# Patient Record
Sex: Female | Born: 1935
Health system: Southern US, Community
[De-identification: ages and names within clinical notes are randomized; demographics above are authoritative.]

## PROBLEM LIST (undated history)

## (undated) DIAGNOSIS — M1990 Unspecified osteoarthritis, unspecified site: Secondary | ICD-10-CM

## (undated) DIAGNOSIS — S329XXA Fracture of unspecified parts of lumbosacral spine and pelvis, initial encounter for closed fracture: Secondary | ICD-10-CM

## (undated) DIAGNOSIS — E0590 Thyrotoxicosis, unspecified without thyrotoxic crisis or storm: Secondary | ICD-10-CM

## (undated) DIAGNOSIS — E785 Hyperlipidemia, unspecified: Secondary | ICD-10-CM

## (undated) DIAGNOSIS — I513 Intracardiac thrombosis, not elsewhere classified: Secondary | ICD-10-CM

## (undated) DIAGNOSIS — I10 Essential (primary) hypertension: Secondary | ICD-10-CM

## (undated) DIAGNOSIS — I4891 Unspecified atrial fibrillation: Secondary | ICD-10-CM

## (undated) HISTORY — DX: Fracture of unspecified parts of lumbosacral spine and pelvis, initial encounter for closed fracture: S32.9XXA

## (undated) HISTORY — DX: Unspecified atrial fibrillation: I48.91

## (undated) HISTORY — DX: Hyperlipidemia, unspecified: E78.5

## (undated) HISTORY — DX: Thyrotoxicosis, unspecified without thyrotoxic crisis or storm: E05.90

## (undated) HISTORY — DX: Unspecified osteoarthritis, unspecified site: M19.90

## (undated) HISTORY — DX: Intracardiac thrombosis, not elsewhere classified: I51.3

## (undated) HISTORY — DX: Essential (primary) hypertension: I10

---
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
## 2006-11-30 ENCOUNTER — Encounter: Payer: Self-pay | Admitting: Internal Medicine

## 2008-06-21 ENCOUNTER — Ambulatory Visit: Payer: Self-pay | Admitting: Internal Medicine

## 2008-06-21 DIAGNOSIS — I10 Essential (primary) hypertension: Secondary | ICD-10-CM | POA: Insufficient documentation

## 2008-06-21 DIAGNOSIS — M1990 Unspecified osteoarthritis, unspecified site: Secondary | ICD-10-CM | POA: Insufficient documentation

## 2008-06-21 DIAGNOSIS — E0590 Thyrotoxicosis, unspecified without thyrotoxic crisis or storm: Secondary | ICD-10-CM | POA: Insufficient documentation

## 2008-06-21 DIAGNOSIS — E785 Hyperlipidemia, unspecified: Secondary | ICD-10-CM

## 2008-06-21 DIAGNOSIS — M810 Age-related osteoporosis without current pathological fracture: Secondary | ICD-10-CM | POA: Insufficient documentation

## 2008-06-26 LAB — CONVERTED CEMR LAB
Albumin: 4.2 g/dL (ref 3.5–5.2)
Basophils Absolute: 0 10*3/uL (ref 0.0–0.1)
Basophils Relative: 0.4 % (ref 0.0–3.0)
Creatinine, Ser: 0.8 mg/dL (ref 0.4–1.2)
Eosinophils Absolute: 0.1 10*3/uL (ref 0.0–0.7)
GFR calc Af Amer: 88 mL/min
GFR calc non Af Amer: 73 mL/min
Hemoglobin: 15.8 g/dL — ABNORMAL HIGH (ref 12.0–15.0)
Lymphocytes Relative: 22.7 % (ref 12.0–46.0)
MCHC: 34.8 g/dL (ref 30.0–36.0)
MCV: 91 fL (ref 78.0–100.0)
Neutro Abs: 4 10*3/uL (ref 1.4–7.7)
Neutrophils Relative %: 64.8 % (ref 43.0–77.0)
Phosphorus: 3.4 mg/dL (ref 2.3–4.6)
RBC: 4.98 M/uL (ref 3.87–5.11)
RDW: 13.7 % (ref 11.5–14.6)
Sodium: 143 meq/L (ref 135–145)

## 2008-07-19 ENCOUNTER — Telehealth: Payer: Self-pay | Admitting: Internal Medicine

## 2008-09-16 ENCOUNTER — Ambulatory Visit: Payer: Self-pay | Admitting: Cardiovascular Disease

## 2008-09-16 ENCOUNTER — Ambulatory Visit: Payer: Self-pay | Admitting: Family Medicine

## 2008-09-16 DIAGNOSIS — R002 Palpitations: Secondary | ICD-10-CM

## 2008-09-19 ENCOUNTER — Encounter: Payer: Self-pay | Admitting: Internal Medicine

## 2008-09-19 ENCOUNTER — Ambulatory Visit: Payer: Self-pay | Admitting: Internal Medicine

## 2008-09-19 ENCOUNTER — Encounter: Payer: Self-pay | Admitting: Cardiovascular Disease

## 2008-09-19 ENCOUNTER — Ambulatory Visit: Payer: Self-pay

## 2008-09-19 ENCOUNTER — Inpatient Hospital Stay (HOSPITAL_COMMUNITY): Admission: AD | Admit: 2008-09-19 | Discharge: 2008-09-20 | Payer: Self-pay | Admitting: Internal Medicine

## 2008-09-20 ENCOUNTER — Encounter (INDEPENDENT_AMBULATORY_CARE_PROVIDER_SITE_OTHER): Payer: Self-pay | Admitting: Pediatrics

## 2008-09-20 ENCOUNTER — Ambulatory Visit: Payer: Self-pay | Admitting: Vascular Surgery

## 2008-09-24 ENCOUNTER — Ambulatory Visit: Payer: Self-pay | Admitting: Internal Medicine

## 2008-09-24 ENCOUNTER — Telehealth: Payer: Self-pay | Admitting: Internal Medicine

## 2008-09-26 ENCOUNTER — Telehealth: Payer: Self-pay | Admitting: Internal Medicine

## 2008-09-30 ENCOUNTER — Ambulatory Visit: Payer: Self-pay | Admitting: Internal Medicine

## 2008-09-30 DIAGNOSIS — I4891 Unspecified atrial fibrillation: Secondary | ICD-10-CM | POA: Insufficient documentation

## 2008-09-30 LAB — CONVERTED CEMR LAB: INR: 3.4

## 2008-10-02 ENCOUNTER — Ambulatory Visit: Payer: Self-pay | Admitting: Internal Medicine

## 2008-10-02 LAB — CONVERTED CEMR LAB
INR: 3.6
Prothrombin Time: 23 s

## 2008-10-09 ENCOUNTER — Ambulatory Visit: Payer: Self-pay | Admitting: Internal Medicine

## 2008-10-09 LAB — CONVERTED CEMR LAB
INR: 2.3
Prothrombin Time: 18.7 s

## 2008-10-23 ENCOUNTER — Ambulatory Visit: Payer: Self-pay | Admitting: Internal Medicine

## 2008-11-20 ENCOUNTER — Ambulatory Visit: Payer: Self-pay | Admitting: Internal Medicine

## 2008-12-04 ENCOUNTER — Ambulatory Visit: Payer: Self-pay | Admitting: Internal Medicine

## 2008-12-04 LAB — CONVERTED CEMR LAB: INR: 2.9

## 2009-01-01 ENCOUNTER — Ambulatory Visit: Payer: Self-pay | Admitting: Internal Medicine

## 2009-01-01 LAB — CONVERTED CEMR LAB
INR: 3.3
Prothrombin Time: 21.8 s

## 2009-01-02 LAB — CONVERTED CEMR LAB
BUN: 16 mg/dL (ref 6–23)
CO2: 29 meq/L (ref 19–32)
Creatinine, Ser: 0.8 mg/dL (ref 0.4–1.2)
Eosinophils Absolute: 0.1 10*3/uL (ref 0.0–0.7)
Eosinophils Relative: 1.1 % (ref 0.0–5.0)
Free T4: 0.7 ng/dL (ref 0.6–1.6)
Glucose, Bld: 88 mg/dL (ref 70–99)
Lymphocytes Relative: 15 % (ref 12.0–46.0)
MCV: 91.7 fL (ref 78.0–100.0)
Monocytes Absolute: 0.7 10*3/uL (ref 0.1–1.0)
Neutrophils Relative %: 73.8 % (ref 43.0–77.0)
Phosphorus: 4.3 mg/dL (ref 2.3–4.6)
Platelets: 191 10*3/uL (ref 150.0–400.0)
TSH: 3.26 microintl units/mL (ref 0.35–5.50)
WBC: 6.9 10*3/uL (ref 4.5–10.5)

## 2009-01-15 ENCOUNTER — Ambulatory Visit: Payer: Self-pay | Admitting: Internal Medicine

## 2009-01-15 LAB — CONVERTED CEMR LAB
INR: 2.3
Prothrombin Time: 18.7 s

## 2009-01-29 ENCOUNTER — Ambulatory Visit: Payer: Self-pay | Admitting: Internal Medicine

## 2009-01-29 LAB — CONVERTED CEMR LAB: Prothrombin Time: 19.9 s

## 2009-02-26 ENCOUNTER — Ambulatory Visit: Payer: Self-pay | Admitting: Internal Medicine

## 2009-02-26 LAB — CONVERTED CEMR LAB: Prothrombin Time: 17.9 s

## 2009-03-27 ENCOUNTER — Telehealth: Payer: Self-pay | Admitting: Internal Medicine

## 2009-03-31 ENCOUNTER — Encounter: Payer: Self-pay | Admitting: Internal Medicine

## 2009-03-31 ENCOUNTER — Ambulatory Visit: Payer: Self-pay

## 2009-04-04 ENCOUNTER — Ambulatory Visit: Payer: Self-pay | Admitting: Internal Medicine

## 2009-09-30 ENCOUNTER — Ambulatory Visit: Payer: Self-pay | Admitting: Internal Medicine

## 2009-10-02 LAB — CONVERTED CEMR LAB
AST: 26 units/L (ref 0–37)
Alkaline Phosphatase: 83 units/L (ref 39–117)
Basophils Relative: 0.1 % (ref 0.0–3.0)
Chloride: 104 meq/L (ref 96–112)
Eosinophils Relative: 1.6 % (ref 0.0–5.0)
Free T4: 0.6 ng/dL (ref 0.6–1.6)
GFR calc non Af Amer: 72.26 mL/min (ref >60)
Hemoglobin: 14 g/dL (ref 12.0–15.0)
Lymphocytes Relative: 18.7 % (ref 12.0–46.0)
Monocytes Relative: 9.4 % (ref 3.0–12.0)
Neutro Abs: 4.6 10*3/uL (ref 1.4–7.7)
Phosphorus: 4.4 mg/dL (ref 2.3–4.6)
Potassium: 4.5 meq/L (ref 3.5–5.1)
RBC: 4.75 M/uL (ref 3.87–5.11)
TSH: 4.44 microintl units/mL (ref 0.35–5.50)
Total Protein: 6.9 g/dL (ref 6.0–8.3)

## 2010-01-28 ENCOUNTER — Ambulatory Visit: Payer: Self-pay | Admitting: Internal Medicine

## 2010-02-23 ENCOUNTER — Telehealth: Payer: Self-pay | Admitting: Internal Medicine

## 2010-06-01 ENCOUNTER — Ambulatory Visit: Payer: Self-pay | Admitting: Internal Medicine

## 2010-06-03 LAB — CONVERTED CEMR LAB
Albumin: 4.4 g/dL (ref 3.5–5.2)
Alkaline Phosphatase: 79 units/L (ref 39–117)
BUN: 17 mg/dL (ref 6–23)
Basophils Absolute: 0 10*3/uL (ref 0.0–0.1)
Chloride: 104 meq/L (ref 96–112)
Eosinophils Absolute: 0.1 10*3/uL (ref 0.0–0.7)
Free T4: 0.97 ng/dL (ref 0.60–1.60)
Glucose, Bld: 89 mg/dL (ref 70–99)
Hemoglobin: 15 g/dL (ref 12.0–15.0)
Lymphocytes Relative: 13.8 % (ref 12.0–46.0)
MCHC: 33.9 g/dL (ref 30.0–36.0)
Monocytes Absolute: 0.8 10*3/uL (ref 0.1–1.0)
Neutro Abs: 7.1 10*3/uL (ref 1.4–7.7)
Phosphorus: 3.9 mg/dL (ref 2.3–4.6)
RDW: 14.7 % — ABNORMAL HIGH (ref 11.5–14.6)

## 2010-10-02 ENCOUNTER — Ambulatory Visit
Admission: RE | Admit: 2010-10-02 | Discharge: 2010-10-02 | Payer: Self-pay | Source: Home / Self Care | Attending: Internal Medicine | Admitting: Internal Medicine

## 2010-10-06 NOTE — Progress Notes (Signed)
Summary: irregular heart rate  Phone Note Call from Patient Call back at Home Phone [phone]   Caller: Patient Call For: Cindee Salt MD Summary of Call: Pt has been having an irregular heart rate since her thyroid medicine dose was reduced.  She has no other sxs, other than some weakness but she says she always has that.  Do you want to work her in sometime? Initial call taken by: Lowella Petties CMA,  February 23, 2010 3:57 PM  Follow-up for Phone Call        might have to go back up on the dose if her heart is really going faster okay to add her on but I don't have any appts till Thursday at 1:45 Follow-up by: Cindee Salt MD,  February 23, 2010 4:31 PM  Additional Follow-up for Phone Call Additional follow up Details #1::        spoke with patient, she doesn't have rapid heartbeat, it's irregular per patient. She states it skips a beat? this started 3-4 days ago, she doesn't know if this is because of the thyroid med or not. Pt just wanted to let you know, she doesn't think she really needs an appt per pt. DeShannon Smith CMA Duncan Dull)  February 23, 2010 5:03 PM   Okay to have her just monitor and let me know if she thinks it is more of a problem--- or causes chest pain, dizziness, etc Additional Follow-up by: Cindee Salt MD,  February 23, 2010 5:19 PM    Additional Follow-up for Phone Call Additional follow up Details #2::    spoke with patient and she will call if she starts to have problems. Follow-up by: Mervin Hack CMA Duncan Dull),  February 24, 2010 8:35 AM

## 2010-10-06 NOTE — Assessment & Plan Note (Signed)
Summary: FU/MK   Vital Signs:  Patient profile:   78 year old female Weight:      110 pounds Temp:     98 degrees F oral Pulse rate:   80 / minute Pulse rhythm:   regular BP sitting:   148 / 88  (left arm) Cuff size:   regular  Vitals Entered By: Mervin Hack CMA [facility] Dull) (September 30, 2009 10:41 AM) CC: 6 month follow-up   History of Present Illness: Doing about the same  Off the coumadin No weakness, speech or swallowing problems  Due for thyroid tests No mouth sores or skin problems (except dry skin and feeling cold)  No palpitations No chest pain Some DOE---"I don't move as fast as I would like to"----- no sig recent visit  Occ mild arthritic pains--esp right hip and back Best if she keeps moving Very stiff if immobile for a while No meds in general  BP has been okay  Allergies: 1)  ! Methimazole (Methimazole)  Past History:  Past medical, surgical, family and social histories (including risk factors) reviewed for relevance to current acute and chronic problems.  Past Medical History: Reviewed history from 10/02/2008 and no changes required. Hypertension Hyperthyroidism--2007 ([facility]) Osteoarthritis Hyperlipidemia Osteoporosis Right ventricular thrombus--1/10  Past Surgical History: Reviewed history from 06/21/2008 and no changes required. Mastoidectomies 1932 Cataracts 2004  Family History: Reviewed history from 06/21/2008 and no changes required. Mom died of pancreatic cancer in 72's Dad died of unclear cause. Did have COPD, Died in his late 67's 1 brother killed in WW2 Sister died in 80's--had DM No CAD No breast or colon cancer  Social History: Reviewed history from 06/21/2008 and no changes required. 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  Wine and occ bourbon No regular exercise  Has living will. Son, Molly Maduro to make health care decisions. Requests DNR order--she has been familiar with this. No tube feeds  Review of Systems       appetite is okay Weight is up 4# sleeps well No regular anxiety or depression. SOme seasonal dysthymia. Misses [place] and friends there  Physical Exam  General:  alert and normal appearance.   Neck:  supple, no masses, no thyromegaly, no carotid bruits, and no cervical lymphadenopathy.   Lungs:  normal respiratory effort and normal breath sounds.   Heart:  normal rate, regular rhythm, no murmur, and no gallop.   Abdomen:  soft, non-tender, and no masses.   Msk:  stiff but no active synovitis Extremities:  no edema Psych:  normally interactive, good eye contact, and not anxious appearing.  Slight melancholy   Impression & Recommendations:  Problem # 1:  ATRIAL FIBRILLATION (ICD-427.31) Assessment Comment Only no evidence of paroxysms will just continue the aspirin  Her updated medication list for this problem includes:    Amlodipine Besylate 5 Mg Tabs (Amlodipine besylate) .Marland Kitchen... 1 daily by mouth    Metoprolol Tartrate 25 Mg Tabs (Metoprolol tartrate) .Marland Kitchen... 1/2 two times a day by mouth    Aspirin Ec 325 Mg Tbec (Aspirin) .Marland Kitchen... 1 daily  Problem # 2:  HYPERTHYROIDISM (ICD-242.90) Assessment: Unchanged  on PTU seems euthyroid due for labs  Her updated medication list for this problem includes:    Propylthiouracil 50 Mg Tabs (Propylthiouracil) .Marland KitchenMarland KitchenMarland KitchenMarland Kitchen 3 tablets twice a day by mouth    Metoprolol Tartrate  25 Mg Tabs (Metoprolol tartrate) .Marland Kitchen... 1/2 two times a day by mouth  Orders: TLB-T4 (Thyrox), Free [phone]) TLB-TSH (Thyroid Stimulating Hormone) (84443-TSH)  Problem # 3:  HYPERTENSION (ICD-401.9) Assessment: Unchanged  good control no changes needed  Her updated medication list for this problem includes:    Ramipril 10 Mg Caps (Ramipril) .Marland Kitchen... 1 tablet daily by  mouth    Amlodipine Besylate 5 Mg Tabs (Amlodipine besylate) .Marland Kitchen... 1 daily by mouth    Metoprolol Tartrate 25 Mg Tabs (Metoprolol tartrate) .Marland Kitchen... 1/2 two times a day by mouth  BP today: 148/88 Prior BP: 140/80 (04/04/2009)  Labs Reviewed: K+: 4.5 (01/01/2009) Creat: : 0.8 (01/01/2009)     Orders: TLB-Renal Function Panel (80069-RENAL) TLB-CBC Platelet - w/Differential (85025-CBCD) TLB-Hepatic/Liver Function Pnl (80076-HEPATIC) Venipuncture (40981)  Problem # 4:  OSTEOPOROSIS (ICD-733.00) Assessment: Comment Only generally takes vitamin D---recommended sticking with 1000 international units daily  Problem # 5:  OSTEOARTHRITIS (ICD-715.90) Assessment: Unchanged doing okay without generally taking meds will leave tramadol on list though Her updated medication list for this problem includes:    Tramadol Hcl 50 Mg Tabs (Tramadol hcl) .Marland Kitchen... 1/2 - 1 three times a day as needed for bad arthritis pain by mouth    Aspirin Ec 325 Mg Tbec (Aspirin) .Marland Kitchen... 1 daily  Complete Medication List: 1)  Propylthiouracil 50 Mg Tabs (Propylthiouracil) .... 3 tablets twice a day by mouth 2)  Ramipril 10 Mg Caps (Ramipril) .Marland Kitchen.. 1 tablet daily by mouth 3)  Amlodipine Besylate 5 Mg Tabs (Amlodipine besylate) .Marland Kitchen.. 1 daily by mouth 4)  Metoprolol Tartrate 25 Mg Tabs (Metoprolol tartrate) .... 1/2 two times a day by mouth 5)  Tramadol Hcl 50 Mg Tabs (Tramadol hcl) .... 1/2 - 1 three times a day as needed for bad arthritis pain by mouth 6)  Aspirin Ec 325 Mg Tbec (Aspirin) .Marland Kitchen.. 1 daily  Patient Instructions: 1)  Please schedule a follow-up appointment in 4 months .   Current Allergies (reviewed today): ! METHIMAZOLE (METHIMAZOLE)

## 2010-10-06 NOTE — Assessment & Plan Note (Signed)
Summary: 4 m f/u dlo   Vital Signs:  Patient profile:   77 year old female Weight:      111 pounds Temp:     97.9 degrees F oral Pulse rate:   68 / minute Pulse rhythm:   regular BP sitting:   122 / 78  (left arm) Cuff size:   regular  Vitals Entered By: Sydell Axon LPN (June 01, 2010 12:26 PM) CC: 4 Month follow-up   History of Present Illness: Doing okay Feels tired at times--"I don't have enough get up and go to suit me" Has to "push" herself just to go grocery shopping, fix food, etc  Appetite isn't great and is picky weight is stable Not ready for assisted living as yet  Not happy or unhappy "I'm getting by" Doesn't enjoy much socialization  Did go back to the 3 PTU daily Did have some heart skipping on the lower dose (not irregular though) No chest pain No SOB No edema  Still has everyday arthritis pain Feels better after moving around for a while in the morning Rarely uses the tramadol does occ take ibuprofen  Allergies: 1)  ! Methimazole (Methimazole)  Past History:  Past medical, surgical, family and social histories (including risk factors) reviewed for relevance to current acute and chronic problems.  Past Medical History: Reviewed history from 01/28/2010 and no changes required. Hypertension Hyperthyroidism--2007 ([facility]) Osteoarthritis Hyperlipidemia Osteoporosis Right ventricular thrombus--1/10  Past Surgical History: Reviewed history from 06/21/2008 and no changes required. Mastoidectomies 1932 Cataracts 2004  Family History: Reviewed history from 06/21/2008 and no changes required. Mom died of pancreatic cancer in 29's Dad died of unclear cause. Did have COPD, Died in his late 50's 1 brother killed in WW2 Sister died in 80's--had DM No CAD No breast or colon cancer  Social History: Reviewed history from 06/21/2008 and no changes required. 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 Wine and occ bourbon No regular exercise  Has living will. Son, Molly Maduro to make health care decisions. Requests DNR order--she has been familiar with this. No tube feeds  Review of Systems       Ongoing hearing problems---that limits her desire to interact in different settings Prefers to stay at home Sleeps well Precious daughter in law having mastectomy soon--this has weighed on her  Physical Exam  General:  alert and normal appearance.   Neck:  supple, no masses, no thyromegaly, no carotid bruits, and no cervical lymphadenopathy.   Lungs:  normal respiratory effort, no intercostal retractions, no accessory muscle use, and normal breath sounds.   Heart:  normal rate, regular rhythm, no murmur, and no gallop.  Rare skip beat Msk:  no joint tenderness and no joint swelling.   Extremities:  no edema Psych:  normally interactive, good eye contact, not anxious appearing, and not depressed appearing.     Impression & Recommendations:  Problem # 1:  HYPERTENSION (ICD-401.9) Assessment Unchanged  doing fine on meds due for labs  Her updated medication list for this problem includes:    Ramipril 10 Mg Caps (Ramipril) .Marland Kitchen... 1 tablet daily by mouth    Amlodipine Besylate 5 Mg Tabs (Amlodipine besylate) .Marland Kitchen... 1 daily by mouth    Metoprolol Tartrate 25 Mg Tabs (Metoprolol tartrate) .Marland Kitchen... 1/2 two times a day by mouth  BP today: 122/78 Prior  BP: 138/80 (01/28/2010)  Labs Reviewed: K+: 4.5 (09/30/2009) Creat: : 0.8 (09/30/2009)     Orders: TLB-Renal Function Panel (80069-RENAL) TLB-CBC Platelet - w/Differential (85025-CBCD) TLB-Hepatic/Liver Function Pnl (80076-HEPATIC) Venipuncture (16109)  Problem # 2:  HYPERTHYROIDISM (ICD-242.90) Assessment: Unchanged  didn't tolerate the decreased PTU dose will check status again  Her  updated medication list for this problem includes:    Propylthiouracil 50 Mg Tabs (Propylthiouracil) .Marland KitchenMarland KitchenMarland KitchenMarland Kitchen 3 tablets by mouth daily    Metoprolol Tartrate 25 Mg Tabs (Metoprolol tartrate) .Marland Kitchen... 1/2 two times a day by mouth  Orders: TLB-T4 (Thyrox), Free [phone]) TLB-TSH (Thyroid Stimulating Hormone) (84443-TSH)  Problem # 3:  OSTEOARTHRITIS (ICD-715.90) Assessment: Unchanged okay with activity, ibuprofen and occ tramadol  Her updated medication list for this problem includes:    Tramadol Hcl 50 Mg Tabs (Tramadol hcl) .Marland Kitchen... 1/2 - 1 three times a day as needed for bad arthritis pain by mouth    Aspirin Ec 325 Mg Tbec (Aspirin) .Marland Kitchen... 1 daily  Problem # 4:  OSTEOPOROSIS (ICD-733.00) Assessment: Comment Only  is on vitamin D  Her updated medication list for this problem includes:    Vitamin D 1000 Unit Tabs (Cholecalciferol) .Marland Kitchen... 1 tab daily  Complete Medication List: 1)  Propylthiouracil 50 Mg Tabs (Propylthiouracil) .... 3 tablets by mouth daily 2)  Ramipril 10 Mg Caps (Ramipril) .Marland Kitchen.. 1 tablet daily by mouth 3)  Amlodipine Besylate 5 Mg Tabs (Amlodipine besylate) .Marland Kitchen.. 1 daily by mouth 4)  Metoprolol Tartrate 25 Mg Tabs (Metoprolol tartrate) .... 1/2 two times a day by mouth 5)  Tramadol Hcl 50 Mg Tabs (Tramadol hcl) .... 1/2 - 1 three times a day as needed for bad arthritis pain by mouth 6)  Aspirin Ec 325 Mg Tbec (Aspirin) .Marland Kitchen.. 1 daily 7)  Vitamin D 1000 Unit Tabs (Cholecalciferol) .Marland Kitchen.. 1 tab daily  Patient Instructions: 1)  Please schedule a follow-up appointment in 4 months .   Current Allergies (reviewed today): ! METHIMAZOLE (METHIMAZOLE)

## 2010-10-06 NOTE — Assessment & Plan Note (Signed)
Summary: 4 MONTH FOLLOW UP/[facility]   Vital Signs:  Patient profile:   77 year old female Weight:      111 pounds BMI:     21.05 Temp:     98.2 degrees F oral Pulse rate:   64 / minute Pulse rhythm:   regular BP sitting:   138 / 80  (left arm) Cuff size:   regular  Vitals Entered By: Mervin Hack CMA Duncan Dull) (Jan 28, 2010 10:38 AM) CC: 4 month follow-up   History of Present Illness: doing okay  No sig new concerns  Happy to be on coumadin instead of the aspirin no neurologic symptoms  Still on PTU still on high dose No skin or hair problems weight has been stable  Regular feelings of fatigue not much energy Still keeps up house No sig exercise no chest pain No SOB  does have some intermittent joint pains occ takes ibuprofen in AM  Allergies: 1)  ! Methimazole (Methimazole)  Past History:  Past medical, surgical, family and social histories (including risk factors) reviewed for relevance to current acute and chronic problems.  Past Medical History: Hypertension Hyperthyroidism--2007 ([facility]) Osteoarthritis Hyperlipidemia Osteoporosis Right ventricular thrombus--1/10  Past Surgical History: Reviewed history from 06/21/2008 and no changes required. Mastoidectomies 1932 Cataracts 2004  Family History: Reviewed history from 06/21/2008 and no changes required. Mom died of pancreatic cancer in 58's Dad died of unclear cause. Did have COPD, Died in his late 3's 1 brother killed in WW2 Sister died in 80's--had DM No CAD No breast or colon cancer  Social History: Reviewed history from 06/21/2008 and no changes required. 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 Wine and occ bourbon No regular exercise  Has living will. Son, Molly Maduro to make health care  decisions. Requests DNR order--she has been familiar with this. No tube feeds  Review of Systems       appetite is fair sleeps well Intermittent depressed times--never lasts all day  Physical Exam  General:  alert and normal appearance.   Neck:  supple, no masses, no thyromegaly, and no cervical lymphadenopathy.   Lungs:  normal respiratory effort and normal breath sounds.   Heart:  normal rate, regular rhythm, no murmur, and no gallop.   Abdomen:  soft and non-tender.   Msk:  no joint tenderness and no joint swelling.   Extremities:  no edema Psych:  normally interactive, good eye contact, not anxious appearing, and dysphoric affect.     Impression & Recommendations:  Problem # 1:  HYPERTENSION (ICD-401.9) Assessment Unchanged good control no changes needed  Her updated medication list for this problem includes:    Ramipril 10 Mg Caps (Ramipril) .Marland Kitchen... 1 tablet daily by mouth    Amlodipine Besylate 5 Mg Tabs (Amlodipine besylate) .Marland Kitchen... 1 daily by mouth    Metoprolol Tartrate 25 Mg Tabs (Metoprolol tartrate) .Marland Kitchen... 1/2 two times a day by mouth  BP today: 138/80 Prior BP: 148/88 (09/30/2009)  Labs Reviewed: K+: 4.5 (09/30/2009) Creat: : 0.8 (09/30/2009)     Problem # 2:  HYPERTHYROIDISM (ICD-242.90) Assessment: Comment Only will decrease PTU to maintenance dose  Her updated medication list for this problem includes:    Propylthiouracil 50 Mg Tabs (Propylthiouracil) .Marland KitchenMarland KitchenMarland KitchenMarland Kitchen 3 tablets by mouth daily    Metoprolol Tartrate 25 Mg Tabs (Metoprolol tartrate) .Marland Kitchen... 1/2 two times  a day by mouth  Labs Reviewed: TSH: 4.44 (09/30/2009)     Problem # 3:  OSTEOARTHRITIS (ICD-715.90) Assessment: Unchanged is limited by pain occ uses ibuprofen not sure tramadol did that much good  Her updated medication list for this problem includes:    Tramadol Hcl 50 Mg Tabs (Tramadol hcl) .Marland Kitchen... 1/2 - 1 three times a day as needed for bad arthritis pain by mouth    Aspirin Ec 325 Mg Tbec  (Aspirin) .Marland Kitchen... 1 daily  Problem # 4:  ATRIAL FIBRILLATION (ICD-427.31) Assessment: Comment Only with the [facility]  Her updated medication list for this problem includes:    Amlodipine Besylate 5 Mg Tabs (Amlodipine besylate) .Marland Kitchen... 1 daily by mouth    Metoprolol Tartrate 25 Mg Tabs (Metoprolol tartrate) .Marland Kitchen... 1/2 two times a day by mouth    Aspirin Ec 325 Mg Tbec (Aspirin) .Marland Kitchen... 1 daily  Complete Medication List: 1)  Propylthiouracil 50 Mg Tabs (Propylthiouracil) .... 3 tablets by mouth daily 2)  Ramipril 10 Mg Caps (Ramipril) .Marland Kitchen.. 1 tablet daily by mouth 3)  Amlodipine Besylate 5 Mg Tabs (Amlodipine besylate) .Marland Kitchen.. 1 daily by mouth 4)  Metoprolol Tartrate 25 Mg Tabs (Metoprolol tartrate) .... 1/2 two times a day by mouth 5)  Tramadol Hcl 50 Mg Tabs (Tramadol hcl) .... 1/2 - 1 three times a day as needed for bad arthritis pain by mouth 6)  Aspirin Ec 325 Mg Tbec (Aspirin) .Marland Kitchen.. 1 daily  Patient Instructions: 1)  Please schedule a follow-up appointment in 4 months .   Current Allergies (reviewed today): ! METHIMAZOLE (METHIMAZOLE)

## 2010-10-08 NOTE — Assessment & Plan Note (Signed)
Summary: FOLLOW UP / [facility]   Vital Signs:  Patient profile:   77 year old female Weight:      107 pounds Temp:     98.0 degrees F oral Pulse rate:   79 / minute Pulse rhythm:   regular BP sitting:   147 / 85  (left arm) Cuff size:   regular  Vitals Entered By: Mervin Hack CMA [facility] Dull) (October 02, 2010 10:59 AM) CC: 4 month follow-up   History of Present Illness: DOing okay Still has limited energy Still does her own shopping  Does all cooking and housekeeping  Still tends to be melancholy Is happy "off and on"  She has ongoing pain but doesn't think it is very bad Hip, knees and back "act up" Uses pain meds very rarely--is effective  Most disturbed by hearing disability Adequate only on right with aide, left has nothing Hard to deal with social situations Likes reading, crossword puzzles, etc  Doing okay with the PTU same dose as usual--3 daily  No chest pain  Occ DOE if "I move faster than I should" Very careful to avoid falling   Allergies: 1)  ! Methimazole (Methimazole)  Past History:  Past medical, surgical, family and social histories (including risk factors) reviewed for relevance to current acute and chronic problems.  Past Medical History: Reviewed history from 01/28/2010 and no changes required. Hypertension Hyperthyroidism--2007 ([facility]) Osteoarthritis Hyperlipidemia Osteoporosis Right ventricular thrombus--1/10  Past Surgical History: Reviewed history from 06/21/2008 and no changes required. Mastoidectomies 1932 Cataracts 2004  Family History: Reviewed history from 06/21/2008 and no changes required. Mom died of pancreatic cancer in 37's Dad died of unclear cause. Did have COPD, Died in his late 28's 1 brother killed in WW2 Sister died in 80's--had DM No CAD No breast or colon cancer  Social History: Reviewed history from 06/21/2008 and no changes required. 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 Wine and occ bourbon No regular exercise  Has living will. Son, Molly Maduro to make health care decisions. Requests DNR order--she has been familiar with this. No tube feeds  Review of Systems       appetite is not great--"bored with fixing food" weight is down 4# sleeps okay in general  Physical Exam  General:  alert.  NAD Neck:  supple, no masses, no thyromegaly, and no cervical lymphadenopathy.   Lungs:  normal respiratory effort, no intercostal retractions, no accessory muscle use, and normal breath sounds.   Heart:  normal rate, regular rhythm, no murmur, and no gallop.   Extremities:  no edema Neurologic:  strength normal in all extremities and gait normal.   Psych:  normally interactive, good eye contact, not anxious appearing, and not depressed appearing.     Impression & Recommendations:  Problem # 1:  HYPERTENSION (ICD-401.9) Assessment Unchanged acceptable control no changes labs next time  Her updated medication list for this problem includes:    Ramipril 10 Mg Caps (Ramipril) .Marland Kitchen... 1 tablet daily by mouth    Amlodipine Besylate 5 Mg Tabs (Amlodipine besylate) .Marland Kitchen... 1 daily by mouth    Metoprolol Tartrate 25 Mg Tabs (Metoprolol tartrate) .Marland Kitchen... 1/2 two times a day by mouth  BP today: 147/85 Prior BP: 122/78 (06/01/2010)  Labs Reviewed: K+: 5.1 (06/01/2010) Creat: : 0.9 (06/01/2010)     Problem # 2:  HYPERTHYROIDISM (ICD-242.90) Assessment: Unchanged stable on the PTU  Her updated medication list for this problem includes:    Propylthiouracil 50 Mg Tabs (Propylthiouracil) .Marland KitchenMarland KitchenMarland KitchenMarland Kitchen 3 tablets by mouth daily    Metoprolol Tartrate 25 Mg Tabs (Metoprolol tartrate) .Marland Kitchen... 1/2 two times a day by mouth  Problem # 3:  OSTEOARTHRITIS (ICD-715.90) Assessment: Unchanged prefers no meds but gets good results  from tramadol when she uses it  Her updated medication list for this problem includes:    Tramadol Hcl 50 Mg Tabs (Tramadol hcl) .Marland Kitchen... 1/2 - 1 three times a day as needed for bad arthritis pain by mouth    Aspirin Ec 325 Mg Tbec (Aspirin) .Marland Kitchen... 1 daily  Complete Medication List: 1)  Propylthiouracil 50 Mg Tabs (Propylthiouracil) .... 3 tablets by mouth daily 2)  Ramipril 10 Mg Caps (Ramipril) .Marland Kitchen.. 1 tablet daily by mouth 3)  Amlodipine Besylate 5 Mg Tabs (Amlodipine besylate) .Marland Kitchen.. 1 daily by mouth 4)  Metoprolol Tartrate 25 Mg Tabs (Metoprolol tartrate) .... 1/2 two times a day by mouth 5)  Tramadol Hcl 50 Mg Tabs (Tramadol hcl) .... 1/2 - 1 three times a day as needed for bad arthritis pain by mouth 6)  Aspirin Ec 325 Mg Tbec (Aspirin) .Marland Kitchen.. 1 daily 7)  Vitamin D 1000 Unit Tabs (Cholecalciferol) .Marland Kitchen.. 1 tab daily  Patient Instructions: 1)  Please schedule a follow-up appointment in 4 months .    Orders Added: 1)  Est. Patient Level IV [16109]    Current Allergies (reviewed today): ! METHIMAZOLE (METHIMAZOLE)

## 2010-12-21 LAB — CBC
HCT: 44.3 % (ref 36.0–46.0)
Hemoglobin: 14.1 g/dL (ref 12.0–15.0)
Hemoglobin: 14.9 g/dL (ref 12.0–15.0)
MCHC: 32.8 g/dL (ref 30.0–36.0)
MCV: 92.9 fL (ref 78.0–100.0)
MCV: 93.1 fL (ref 78.0–100.0)
RBC: 4.63 MIL/uL (ref 3.87–5.11)
RBC: 4.75 MIL/uL (ref 3.87–5.11)
WBC: 5.9 10*3/uL (ref 4.0–10.5)
WBC: 5.9 10*3/uL (ref 4.0–10.5)

## 2010-12-21 LAB — BASIC METABOLIC PANEL
CO2: 32 mEq/L (ref 19–32)
Chloride: 104 mEq/L (ref 96–112)
GFR calc Af Amer: >60 mL/min (ref >60)
Potassium: 4.8 mEq/L (ref 3.5–5.1)
Sodium: 143 mEq/L (ref 135–145)

## 2011-01-19 NOTE — Discharge Summary (Signed)
[name], [name] NO.:  [id number]   MEDICAL RECORD NO.:  [id number]          PATIENT TYPE:  INP   LOCATION:  3703                         FACILITY:  [facility]   PHYSICIAN:  Veverly Fells. Excell Seltzer, MD  DATE OF BIRTH:  Mar 26, 1921   DATE OF ADMISSION:  09/19/2008  DATE OF DISCHARGE:  09/20/2008                               DISCHARGE SUMMARY   PRIMARY CARDIOLOGIST:  Dr. Tonny Bollman.   PRIMARY CARE PHYSICIAN:  Dr. Karleen Hampshire Copland of [facility].   DISCHARGE DIAGNOSIS:  Right ventricular apical clot requiring Coumadin  anticoagulation.   SECONDARY DIAGNOSES:  1. Hypertension.  2. Hyperlipidemia.  3. Hyperthyroidism treated with PTU.  4. Possible atrial fibrillation.  5. Osteoarthritis.  6. Osteoporosis.   ALLERGIES:  METHIMAZOLE.   PROCEDURES PERFORMED:  Bilateral lower extremity venous Dopplers  revealing no obvious DVT, superficial thrombosis or Baker's cyst.   HISTORY OF PRESENT ILLNESS:  This is an 77 year old female with the  above problem list.  She was recently seen by Dr. Excell Seltzer January 11 for  episode of chest pressure radiating to her jaw and also reported episode  of tachy palpitations at that time.  There was some suspicion for atrial  fibrillation and the patient was set up for a 2-D echocardiogram as well  as Holter monitoring.  She presented January 14 to the [facility]  cardiology office for 2-D echocardiogram and this was performed in the  office revealing an EF 60% with LVH and most notably a mobile clot in  the right ventricular apex.  This was reviewed by Dr. Gala Romney in the  office and decision made to admit the patient for anticoagulation.   HOSPITAL COURSE:  Given the patient's history of chest pain now in the  setting of right ventricular apical thrombus, VQ scan was performed and  was low probability for pulmonary embolism.  Venous Dopplers were also  performed and showed no obvious  evidence of DVT, superficial thrombosis  ir Baker's  cyst.  We initiated the patient on Coumadin as well as  heparin therapy and will plan to switch her from heparin to Lovenox as a  bridge until her INR is therapeutic.  We have made arrangements for her  to be transferred to [facility] skilled nursing facility where she did  have her INR checked on Monday January 18.  We recommend continuation of  Lovenox until she has  reached her goal INR of 2 to 3.  Notably Ms.  Mathews has been a asymptomatic throughout her hospitalization and is  being discharged home today in good condition.   DISCHARGE LABS:  Hemoglobin 14.9, hematocrit 44.3, WBC 5.9, platelets  212.  Sodium 143, potassium 4.8, chloride 104, CO2 32, BUN 12,  creatinine 0.75, glucose 103, INR is 1.0.   DISPOSITION:  The patient will be discharged  to skilled nursing today  in good condition.   FOLLOW-UP PLANS AND APPOINTMENTS:  We have arranged for follow-up with  Dr. Excell Seltzer on February 15 at 11:45 a.m.Marland Kitchen  Plan for repeat 2-D  echocardiogram in approximately 4 to 6 weeks.  It was recommended she  followup with Dr. Patsy Lager as scheduled.  We recommended follow-up PT/INR  Monday, January 18.   DISCHARGE MEDICATIONS:  Coumadin 3 mg q.h.s. until directed otherwise,  ramipril  10 mg daily,  Lovenox 45 mg injection q.12 h, amlodipine 5 mg  daily, Lopressor 25 mg half tablet b.i.d., PTU 50 mg 3 tablets b.i.d.   OUTSTANDING LAB STUDIES:  Follow-up PT/INR.   Duration discharge encounter:  45 minutes including physician time.      Nicolasa Ducking, ANP      Veverly Fells. Excell Seltzer, MD  Electronically Signed    CB/MEDQ  D:  09/20/2008  T:  09/20/2008  Job:  [id number]   cc:   Juleen China, MD

## 2011-01-19 NOTE — Letter (Signed)
September 16, 2008    Juleen China, MD  [address] [place], [place] [address]   RE:  TRANNIE, BARDALES  MRN:  [id number]  /  DOB:  05-04-1921   Dear Karleen Hampshire:   It was my pleasure to see [name] on September 16, 2008, for  evaluation of palpitations.   The patient is a delightful 77 year old woman who experienced chest  pressure as well as dull neck and jaw pain that occurred approximately  48 hours ago.  The episode was self-limited and lasted for less than 1  hour.  She has had no other episodes of chest, back, neck, or jaw pain.  The patient resides at [facility] and is fairly sedentary.  She noted  low blood pressure readings over the past several days and incidentally  she was checking her blood pressure and found that her heart rates were  elevated in the range of 150 beats per minute at rest.  This concurred  with the timing of her chest discomfort.  Her baseline heart rate runs  in the mid 90s.  When she saw the rapid heart rate, she checked her  radial pulse and noted that her pulse was very irregular.  She has had  no further problems over the last 48 hours.   She has a history of fast heart rates when she was having problems with  hyperthyroidism back in 2008.  After her thyroid came under better  control, the problem resolved.  She has had some generalized weakness  over the last several days, otherwise he has no specific complaints.  She denies dyspnea, orthopnea, PND, edema, or palpitations.  She has had  no lightheadedness or syncope.   PAST MEDICAL HISTORY:  Hyperthyroidism treated with PTU, osteoarthritis,  hyperlipidemia, hypertension, and osteoporosis.   PAST SURGICAL HISTORY:  Include remote mastoidectomy in 1932 and  cataract surgery in 2004.   MEDICATIONS:  1. PTU 50 mg three twice daily.  2. Ramipril 10 mg daily.  3. Amlodipine 5 mg daily.   ALLERGIES:  METHIMAZOLE.   FAMILY HISTORY:  There is no history of coronary artery disease  in the  family.  She has a sister who died at age 87 of complications from  diabetes.   SOCIAL HISTORY:  The patient is widowed.  She lives in [place].  She  has smoked half a pack of cigarettes daily for 50 years.  She drinks one  alcoholic beverage daily.  She does not participate in regular exercise.   REVIEW OF SYSTEMS:  A 12-point review of systems was performed.  Pertinent positives included anxiety, generalized fatigue, and joint  pains secondary to osteoarthritis.  All other systems were reviewed and  are negative except as outlined above.   PHYSICAL EXAMINATION:  GENERAL:  The patient is alert and oriented  elderly woman in no acute distress.  Weight 105 pounds, blood pressure  was reported at 160/95, heart rate 106.  On my check, the patient's  blood pressure was 110/70 and heart rate was 95 and regular.  HEENT:  Normal.  NECK:  Normal carotid upstrokes.  No bruits.  JVP normal.  No  thyromegaly or thyroid nodules.  LUNGS:  Clear bilaterally.  HEART:  Apex is discrete and nondisplaced.  HEART:  Regular rate and rhythm.  There are no murmurs or gallops.  ABDOMEN:  Soft and nontender.  No organomegaly.  BACK:  No CVA tenderness.  EXTREMITIES:  No clubbing, cyanosis, or edema.  Peripheral pulses are  intact and equal.  SKIN:  Warm and dry without rash.    EKG was reviewed from this morning at [facility] that showed  sinus tachycardia with a heart rate 100 beats per minute, otherwise  within normal limits.   Lab results from October 2009 showed a normal TSH and free T4 as well as  normal metabolic panel.   ASSESSMENT:  This is an 77 year old woman with chest pain and rapid  heart rate.  The most common diagnosis in a patient of this age group  would be rapid atrial fibrillation.  I suspect that was the patient's  rhythm at the time of her fast heart rate recordings.  This could  explain her symptoms of chest discomfort as well.  She is also  predisposed to  atrial fib in the setting of her thyroid disease even  though she is euthyroid at present on PTU.  I had a long discussion with  Ms. Stelzner regarding the diagnosis of atrial fibrillation.  While we have  not captured this on EKG.  I think it is most likely explanation of her  symptoms.  We will check a 48-hour Holter monitor to evaluate for  arrhythmia and try to secure a diagnosis.  Also, we will repeat her  thyroid function studies to make sure that she has remained in euthyroid  since 77 it has been months from her previous lab work.  I would like to  check an echocardiogram to assess for any structural heart disease.   I initially recommended a Myoview stress scan because I am concerned  about her symptoms of chest and jaw pain even though it was an isolated  episode.  The patient really wants to minimize testing and said that she  would not want to undergo cardiac catheterization if the stress test was  abnormal.  Therefore, I would defer on this at present.  If she has  recurrent chest discomfort, we will revisit the issue.  I would like her  to start 81 mg aspirin daily and continue her other medications without  changes.  We will follow up with her in 1 month after there is time to  review the results of her Holter monitor and echocardiogram as well as  her thyroid function test.   Karleen Hampshire, thanks again for asking me to evaluate Ms. Mathews Robinsons.  Please feel  free to call at any time with questions regarding her care.    Sincerely,      Veverly Fells. Excell Seltzer, MD  Electronically Signed    MDC/MedQ  DD: 09/16/2008  DT: 09/17/2008  Job #: [id number]   CC:    Karie Schwalbe, MD

## 2011-01-19 NOTE — H&P (Signed)
Alice, Mathews NO.:  [id number]   MEDICAL RECORD NO.:  [id number]          PATIENT TYPE:  INP   LOCATION:  NA                           FACILITY:  [facility]   PHYSICIAN:  Bevelyn Buckles. Bensimhon, MDDATE OF BIRTH:  [birth year]   DATE OF ADMISSION:  09/19/2008  DATE OF DISCHARGE:                              HISTORY & PHYSICAL   PRIMARY CARE PHYSICIAN:  Spencer T. Copland, M.D., at [facility].   CARDIOLOGIST:  Tonny Bollman, M.D.   REASON FOR ADMISSION:  RV apical clot, need for anticoagulation.   [name] is an 77 year old woman with a history of hyperthyroidism,  hypertension and hyperlipidemia.  She was referred to see Dr. Excell Seltzer a  few days ago, on January 11th, for an episode of chest pressure  radiating to her jaw.  This was a one-time episode and lasted for about  an hour.  She has noticed over the past several days she has also had  episodes of tachycardia and palpitations and her heart rate was  irregular and up to 150 beats per minute.  She saw Dr. Excell Seltzer who was  suspicious for atrial fibrillation and also possible underlying coronary  artery disease.  He set her up for an echocardiogram and Holter monitor  as well as thyroid function test.  She presented today to the office for  echocardiogram.  This showed a normal left ventricular ejection fraction  of about 60%, there was some LVH.  Most notably, it appeared that she  had a mobile clot in the RV apex.   She currently says she is feeling well, she denies any chest pain or  shortness of breath, no fevers or chills.  She does have chronic  osteoarthritis pain.  She has not had any bleeding.  She is, thus, being  admitted for anticoagulation.   REVIEW OF SYSTEMS:  Notable for anxiety, generalized fatigue and  arthritis pain.  Otherwise, all systems are negative except for HPI and  problem list.   PROBLEM LIST:  1. History of hyperthyroidism, treated with PTU.  2. Hypertension.  3.  Hyperlipidemia.  4. Possible atrial fibrillation.  5. Osteoarthritis.  6. Osteoporosis.   PAST SURGICAL HISTORY:  Include:  1. A remote mastoidectomy in 1932.  2. Cataract surgery in 2004.   CURRENT MEDICATIONS:  1. PTU 50 mg three times a day.  2. Ramipril 10 a day.  3. Amlodipine 5 a day.   ALLERGIES:  METHIMAZOLE.   SOCIAL HISTORY:  She is widowed.  She lives in [place].  She has  smoked a half pack of cigarettes per day for 50 years.  She drinks 1  alcoholic beverage daily.  She does not participate in regular exercise.   FAMILY HISTORY:  No family history of coronary artery disease.  She has  a sister who died at 2 due to complications of diabetes.   PHYSICAL EXAM:  She is an elderly frail woman in no acute distress,  ambulates around the clinic without any respiratory difficulty.  Blood  pressure is 120/78, heart rate is 90.  HEENT:  Normal.  NECK:  Supple with no JVD.  Carotid are 2+ bilaterally without any  bruits.  There is no lymphadenopathy or thyromegaly appreciated.  CARDIAC:  PMI is nondisplaced.  She had a regular rate and rhythm with  an S4 normal, no murmur.  LUNGS:  Clear.  ABDOMEN:  Soft, nontender, nondistended.  No hepatosplenomegaly, no  bruits, no masses.  EXTREMITIES:  Warm with no cyanosis, clubbing or edema, good pulses.  NEURO:  Alert and oriented x3.  Cranial nerves II-XII are intact.  Moves  all four extremities without difficulty.  Affect is pleasant.   ASSESSMENT:  1. Right ventricular apical clot on echocardiogram.  2. History of recent chest pain radiating to the jaw, question      coronary artery disease versus pulmonary embolus.  3. Palpitations, question paroxysmal atrial fibrillation.  4. Hypertension.  5. Hyperlipidemia.   At this juncture, we will admit her for anticoagulation with heparin and  transition to Coumadin.  She will need a VQ scan to assess for a  pulmonary embolus.  I have discussed this with Ms. Hitch and her  son.  He will come from [facility] to be with her.  She will also, at some  point, need a Myoview for rule out underlying coronary artery disease.      Bevelyn Buckles. Bensimhon, MD  Electronically Signed     DRB/MEDQ  D:  09/19/2008  T:  09/19/2008  Job:  [id number]   cc:   Juleen China, MD  Veverly Fells. Excell Seltzer, MD

## 2011-01-21 ENCOUNTER — Encounter: Payer: Self-pay | Admitting: Internal Medicine

## 2011-01-22 ENCOUNTER — Ambulatory Visit (INDEPENDENT_AMBULATORY_CARE_PROVIDER_SITE_OTHER): Payer: Medicare Other | Admitting: Internal Medicine

## 2011-01-22 ENCOUNTER — Encounter: Payer: Self-pay | Admitting: Internal Medicine

## 2011-01-22 VITALS — BP 140/80 | HR 75 | Temp 98.0°F | Ht 61.0 in | Wt 105.0 lb

## 2011-01-22 DIAGNOSIS — M1990 Unspecified osteoarthritis, unspecified site: Secondary | ICD-10-CM

## 2011-01-22 DIAGNOSIS — I4891 Unspecified atrial fibrillation: Secondary | ICD-10-CM

## 2011-01-22 DIAGNOSIS — B079 Viral wart, unspecified: Secondary | ICD-10-CM

## 2011-01-22 DIAGNOSIS — E0590 Thyrotoxicosis, unspecified without thyrotoxic crisis or storm: Secondary | ICD-10-CM

## 2011-01-22 DIAGNOSIS — I10 Essential (primary) hypertension: Secondary | ICD-10-CM

## 2011-01-22 LAB — HEPATIC FUNCTION PANEL
ALT: 21 U/L (ref 0–35)
Alkaline Phosphatase: 68 U/L (ref 39–117)
Bilirubin, Direct: 0.1 mg/dL (ref 0.0–0.3)
Total Bilirubin: 0.5 mg/dL (ref 0.3–1.2)
Total Protein: 6.6 g/dL (ref 6.0–8.3)

## 2011-01-22 LAB — BASIC METABOLIC PANEL
CO2: 30 mEq/L (ref 19–32)
GFR: 80.07 mL/min (ref >60.00)
Glucose, Bld: 85 mg/dL (ref 70–99)
Potassium: 4.6 mEq/L (ref 3.5–5.1)
Sodium: 141 mEq/L (ref 135–145)

## 2011-01-22 LAB — CBC WITH DIFFERENTIAL/PLATELET
Basophils Absolute: 0 10*3/uL (ref 0.0–0.1)
Basophils Relative: 0.6 % (ref 0.0–3.0)
Eosinophils Absolute: 0.1 10*3/uL (ref 0.0–0.7)
Hemoglobin: 13.6 g/dL (ref 12.0–15.0)
Lymphocytes Relative: 13.5 % (ref 12.0–46.0)
MCHC: 32.9 g/dL (ref 30.0–36.0)
MCV: 85.1 fl (ref 78.0–100.0)
Monocytes Absolute: 0.6 10*3/uL (ref 0.1–1.0)
Neutro Abs: 6 10*3/uL (ref 1.4–7.7)
RBC: 4.87 Mil/uL (ref 3.87–5.11)
RDW: 16.9 % — ABNORMAL HIGH (ref 11.5–14.6)

## 2011-01-22 LAB — T4, FREE: Free T4: 0.99 ng/dL (ref 0.60–1.60)

## 2011-01-22 NOTE — Progress Notes (Signed)
Subjective:    Patient ID: [name], female    DOB: 09/18/1920, 77 y.o.   MRN: [id number]  HPI "Okay,,,,,I;m here" Has had "sty" on left eyelid for months No pain and doesn't bother her Not excited about dealing with it  Occ notes heart skips Not really aware of problems No chest pain No SOB  Stays tired "all the time" but no change Still does all instrumental ADLs--just has to take her time  Mild arthritis pain only Mostly in AM--right hip esp Rarely uses tramadol---it does help  Current outpatient prescriptions:amLODipine (NORVASC) 5 MG tablet, Take 5 mg by mouth daily.  , Disp: , Rfl: ;  aspirin 325 MG tablet, Take 325 mg by mouth daily.  , Disp: , Rfl: ;  cholecalciferol (VITAMIN D) 1000 UNITS tablet, Take 1,000 Units by mouth daily.  , Disp: , Rfl: ;  metoprolol tartrate (LOPRESSOR) 25 MG tablet, Take 12.5 mg by mouth 2 (two) times daily.  , Disp: , Rfl:  propylthiouracil (PTU) 50 MG tablet, Take 150 mg by mouth daily.  , Disp: , Rfl: ;  ramipril (ALTACE) 10 MG capsule, Take 10 mg by mouth daily.  , Disp: , Rfl: ;  traMADol (ULTRAM) 50 MG tablet, Take 50-100 mg by mouth 3 (three) times daily as needed.  , Disp: , Rfl:   Past Medical History  Diagnosis Date  . Hypertension   . Thyroid disease   . Arthritis   . Hyperlipidemia   . Osteoporosis   . Right ventricular thrombus   . Cataract     Past Surgical History  Procedure Date  . Mastoidectomy 1932    Family History  Problem Relation Age of Onset  . Cancer Mother     pancreatic cancer  . COPD Father   . Diabetes Sister   . Coronary artery disease Neg Hx     History   Social History  . Marital Status: Widowed    Spouse Name: N/A    Number of Children: 4  . Years of Education: N/A   Occupational History  . homemaker    Social History Main Topics  . Smoking status: Current Some Day Smoker  . Smokeless tobacco: Never Used  . Alcohol Use: Yes     wine and burbon  . Drug Use: Not on file  .  Sexually Active: Not on file   Other Topics Concern  . 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 Son, Molly Maduro to make health care decisions. Requests DNR order--she has been familiar with this. No tube feeds   Review of Systems Appetite isn't great but she does eat Weight is stable Sleep is fine---occ restless night but not freq     Objective:   Physical Exam  Constitutional: She appears well-developed and well-nourished. No distress.  Neck: Normal range of motion. Neck supple. No thyromegaly present.  Cardiovascular: Normal rate, regular rhythm and normal heart sounds.  Exam reveals no gallop.   No murmur heard. Pulmonary/Chest: Effort normal and breath sounds normal. No respiratory distress. She has no wheezes. She has no rales.  Abdominal: Soft. There is no tenderness.  Musculoskeletal: Normal range of motion. She exhibits no edema and no tenderness.  Lymphadenopathy:    She has no cervical adenopathy.  Skin:  Warty growth on upper left lid--just past midway laterally  Psychiatric: Her behavior is normal. Judgment and thought content normal.       Same melancholy but not depressed Affect appropriate          Assessment & Plan:

## 2011-03-24 ENCOUNTER — Other Ambulatory Visit: Payer: Self-pay | Admitting: Internal Medicine

## 2011-03-24 NOTE — Telephone Encounter (Signed)
Rx sent electronically.  

## 2011-04-05 ENCOUNTER — Other Ambulatory Visit: Payer: Self-pay | Admitting: *Deleted

## 2011-04-05 MED ORDER — RAMIPRIL 10 MG PO CAPS: 10.0000 mg | ORAL_CAPSULE | Freq: Every day | ORAL | Status: DC

## 2011-04-19 ENCOUNTER — Other Ambulatory Visit: Payer: Self-pay | Admitting: Internal Medicine

## 2011-04-19 NOTE — Telephone Encounter (Signed)
Okay to refill for 1 year. 

## 2011-04-19 NOTE — Telephone Encounter (Signed)
rx sent to pharmacy by e-script  

## 2011-04-19 NOTE — Telephone Encounter (Signed)
I think this if for the thyroid? Ok to refill?

## 2011-05-26 ENCOUNTER — Other Ambulatory Visit: Payer: Self-pay | Admitting: Internal Medicine

## 2011-07-26 ENCOUNTER — Ambulatory Visit (INDEPENDENT_AMBULATORY_CARE_PROVIDER_SITE_OTHER): Payer: Medicare Other | Admitting: Internal Medicine

## 2011-07-26 ENCOUNTER — Encounter: Payer: Self-pay | Admitting: Internal Medicine

## 2011-07-26 VITALS — BP 161/91 | HR 76 | Ht 61.0 in | Wt 102.0 lb

## 2011-07-26 DIAGNOSIS — M1990 Unspecified osteoarthritis, unspecified site: Secondary | ICD-10-CM

## 2011-07-26 DIAGNOSIS — E0590 Thyrotoxicosis, unspecified without thyrotoxic crisis or storm: Secondary | ICD-10-CM

## 2011-07-26 DIAGNOSIS — I10 Essential (primary) hypertension: Secondary | ICD-10-CM

## 2011-07-26 DIAGNOSIS — I4891 Unspecified atrial fibrillation: Secondary | ICD-10-CM

## 2011-07-26 LAB — CBC WITH DIFFERENTIAL/PLATELET
Basophils Relative: 0.4 % (ref 0.0–3.0)
Eosinophils Relative: 1.3 % (ref 0.0–5.0)
Hemoglobin: 14.9 g/dL (ref 12.0–15.0)
Lymphocytes Relative: 14.1 % (ref 12.0–46.0)
MCHC: 33.3 g/dL (ref 30.0–36.0)
Monocytes Relative: 10.1 % (ref 3.0–12.0)
Neutro Abs: 4.9 10*3/uL (ref 1.4–7.7)
Neutrophils Relative %: 74.1 % (ref 43.0–77.0)
RBC: 5.01 Mil/uL (ref 3.87–5.11)
WBC: 6.7 10*3/uL (ref 4.5–10.5)

## 2011-07-26 LAB — BASIC METABOLIC PANEL
CO2: 28 mEq/L (ref 19–32)
Calcium: 9.3 mg/dL (ref 8.4–10.5)
Sodium: 141 mEq/L (ref 135–145)

## 2011-07-26 LAB — HEPATIC FUNCTION PANEL
ALT: 24 U/L (ref 0–35)
AST: 25 U/L (ref 0–37)
Alkaline Phosphatase: 81 U/L (ref 39–117)
Bilirubin, Direct: 0 mg/dL (ref 0.0–0.3)
Total Bilirubin: 0.5 mg/dL (ref 0.3–1.2)
Total Protein: 7.5 g/dL (ref 6.0–8.3)

## 2011-07-26 LAB — T4, FREE: Free T4: 0.92 ng/dL (ref 0.60–1.60)

## 2011-07-26 NOTE — Progress Notes (Signed)
Subjective:    Patient ID: [name], female    DOB: 17-May-1921, 77 y.o.   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 amLODipine (NORVASC) 5 MG tablet TAKE 1 TABLET EVERY DAY  30 tablet  1  . aspirin 325 MG tablet Take 325 mg by mouth daily.        . cholecalciferol (VITAMIN D) 1000 UNITS tablet Take 1,000 Units by mouth daily.        . metoprolol tartrate (LOPRESSOR) 25 MG tablet Take 12.5 mg by mouth 2 (two) times daily.        Marland Kitchen propylthiouracil (PTU) 50 MG tablet TAKE 3 TABLETS 2 TIMES A DAY  180 tablet  11  . ramipril (ALTACE) 10 MG capsule Take 1 capsule (10 mg total) by mouth daily.  30 capsule  6  . traMADol (ULTRAM) 50 MG tablet TAKE 1/2 TO 1 TABLET 3 TIMES A DAY AS NEEDED FOR BAD ATHRITIS PAIN  90 tablet  0    Allergies  Allergen Reactions  . Methimazole     REACTION: increased LFT's    Past Medical History  Diagnosis Date  . Hypertension   . Thyroid disease   . Arthritis   . Hyperlipidemia   . Osteoporosis   . Right ventricular thrombus   . Cataract     Past Surgical History  Procedure Date  . Mastoidectomy 1932    Family History  Problem Relation Age of Onset  . Cancer Mother     pancreatic cancer  . COPD Father   . Diabetes Sister   . Coronary artery disease Neg Hx     History   Social History  . Marital Status: Widowed    Spouse Name: N/A    Number of Children: 4  . Years of Education: N/A    Occupational History  . homemaker    Social History Main Topics  . Smoking status: Current Some Day Smoker  . Smokeless tobacco: Never Used  . Alcohol Use: Yes     wine and burbon  . Drug Use: Not on file  . Sexually Active: Not on file   Other Topics Concern  . 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 Son, Molly Maduro to make health care decisions. Requests DNR order--she has been familiar with this. No tube feeds   Review of Systems Sleeps okay Appetite is okay--never great Weight is fairly stable    Objective:   Physical Exam  Constitutional: She appears well-developed and well-nourished. No distress.  Neck: Normal range of motion. Neck supple. No thyromegaly present.  Cardiovascular: Normal rate, regular rhythm and normal heart sounds.  Exam reveals no gallop.   No murmur heard.      occ skip beats  Pulmonary/Chest: Effort normal  and breath sounds normal. No respiratory distress. She has no wheezes. She has no rales.  Musculoskeletal: Normal range of motion. She exhibits no edema and no tenderness.  Lymphadenopathy:    She has no cervical adenopathy.  Psychiatric: She has a normal mood and affect. Her behavior is normal. Judgment and thought content normal.          Assessment & Plan:

## 2011-07-26 NOTE — Assessment & Plan Note (Signed)
Seems to be euthyroid on the PTU Will check labs again

## 2011-07-26 NOTE — Assessment & Plan Note (Signed)
BP Readings from Last 3 Encounters:  07/26/11 161/91  01/22/11 140/80  10/02/10 147/85   Has had reasonable control  No change despite elevation today

## 2011-07-26 NOTE — Assessment & Plan Note (Signed)
Does okay with occ tramadol 

## 2011-07-26 NOTE — Assessment & Plan Note (Signed)
She has not had any palpitations Was related to hyperthyroidism only apparently

## 2011-09-09 ENCOUNTER — Other Ambulatory Visit: Payer: Self-pay | Admitting: Internal Medicine

## 2011-10-29 ENCOUNTER — Other Ambulatory Visit: Payer: Self-pay

## 2011-10-29 MED ORDER — RAMIPRIL 10 MG PO CAPS: 10.0000 mg | ORAL_CAPSULE | Freq: Every day | ORAL | Status: DC

## 2011-10-29 NOTE — Telephone Encounter (Signed)
Received fax refill request from patient's pharmacy.  Prescription refilled. 

## 2012-01-25 ENCOUNTER — Ambulatory Visit (INDEPENDENT_AMBULATORY_CARE_PROVIDER_SITE_OTHER): Payer: Medicare Other | Admitting: Internal Medicine

## 2012-01-25 ENCOUNTER — Encounter: Payer: Self-pay | Admitting: Internal Medicine

## 2012-01-25 VITALS — BP 128/80 | HR 68 | Temp 97.6°F | Ht 61.0 in | Wt 103.0 lb

## 2012-01-25 DIAGNOSIS — M1990 Unspecified osteoarthritis, unspecified site: Secondary | ICD-10-CM

## 2012-01-25 DIAGNOSIS — I10 Essential (primary) hypertension: Secondary | ICD-10-CM

## 2012-01-25 DIAGNOSIS — E0590 Thyrotoxicosis, unspecified without thyrotoxic crisis or storm: Secondary | ICD-10-CM

## 2012-01-25 DIAGNOSIS — I4891 Unspecified atrial fibrillation: Secondary | ICD-10-CM

## 2012-01-25 LAB — HEPATIC FUNCTION PANEL
AST: 25 U/L (ref 0–37)
Albumin: 4.2 g/dL (ref 3.5–5.2)
Alkaline Phosphatase: 72 U/L (ref 39–117)
Bilirubin, Direct: 0 mg/dL (ref 0.0–0.3)
Total Protein: 7.2 g/dL (ref 6.0–8.3)

## 2012-01-25 LAB — CBC WITH DIFFERENTIAL/PLATELET
Basophils Relative: 0.4 % (ref 0.0–3.0)
Eosinophils Relative: 1.3 % (ref 0.0–5.0)
HCT: 48 % — ABNORMAL HIGH (ref 36.0–46.0)
Hemoglobin: 15.7 g/dL — ABNORMAL HIGH (ref 12.0–15.0)
Lymphs Abs: 1 10*3/uL (ref 0.7–4.0)
Monocytes Relative: 9.8 % (ref 3.0–12.0)
Neutro Abs: 6.1 10*3/uL (ref 1.4–7.7)
RBC: 5.23 Mil/uL — ABNORMAL HIGH (ref 3.87–5.11)
WBC: 8.1 10*3/uL (ref 4.5–10.5)

## 2012-01-25 LAB — BASIC METABOLIC PANEL
GFR: 67.94 mL/min (ref >60.00)
Glucose, Bld: 81 mg/dL (ref 70–99)
Potassium: 5.2 mEq/L — ABNORMAL HIGH (ref 3.5–5.1)
Sodium: 142 mEq/L (ref 135–145)

## 2012-01-25 LAB — T4, FREE: Free T4: 0.88 ng/dL (ref 0.60–1.60)

## 2012-01-25 NOTE — Assessment & Plan Note (Signed)
BP Readings from Last 3 Encounters:  01/25/12 128/80  07/26/11 161/91  01/22/11 140/80   Better control No changes needed
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
## 2012-01-25 NOTE — Assessment & Plan Note (Signed)
Seems to be controlled with PTU Will check labs

## 2012-01-25 NOTE — Assessment & Plan Note (Signed)
Uses tylenol and needs the tramadol once in a while---this helps when it is worse

## 2012-01-25 NOTE — Assessment & Plan Note (Signed)
Only with the hyperthyroidism No evidence of recurrence On ASA daily

## 2012-01-27 ENCOUNTER — Encounter: Payer: Self-pay | Admitting: *Deleted

## 2012-01-29 ENCOUNTER — Other Ambulatory Visit: Payer: Self-pay | Admitting: Internal Medicine

## 2012-05-29 ENCOUNTER — Other Ambulatory Visit: Payer: Self-pay | Admitting: *Deleted

## 2012-05-29 MED ORDER — RAMIPRIL 10 MG PO CAPS: 10.0000 mg | ORAL_CAPSULE | Freq: Every day | ORAL | Status: DC

## 2012-07-26 ENCOUNTER — Encounter: Payer: Self-pay | Admitting: Internal Medicine

## 2012-07-26 ENCOUNTER — Ambulatory Visit (INDEPENDENT_AMBULATORY_CARE_PROVIDER_SITE_OTHER): Payer: Medicare Other | Admitting: Internal Medicine

## 2012-07-26 VITALS — BP 140/80 | HR 72 | Temp 97.6°F | Wt 106.0 lb

## 2012-07-26 DIAGNOSIS — I4891 Unspecified atrial fibrillation: Secondary | ICD-10-CM

## 2012-07-26 DIAGNOSIS — I10 Essential (primary) hypertension: Secondary | ICD-10-CM

## 2012-07-26 DIAGNOSIS — E0590 Thyrotoxicosis, unspecified without thyrotoxic crisis or storm: Secondary | ICD-10-CM

## 2012-07-26 DIAGNOSIS — M1990 Unspecified osteoarthritis, unspecified site: Secondary | ICD-10-CM

## 2012-07-26 MED ORDER — PROPYLTHIOURACIL 50 MG PO TABS: 150.0000 mg | ORAL_TABLET | Freq: Every day | ORAL | Status: DC

## 2012-07-26 NOTE — Assessment & Plan Note (Signed)
BP Readings from Last 3 Encounters:  07/26/12 140/80  01/25/12 128/80  07/26/11 161/91   Reasonable control No changes needed

## 2012-07-26 NOTE — Assessment & Plan Note (Signed)
Probably from multinodular goiter Controlled with PTU

## 2012-07-26 NOTE — Progress Notes (Signed)
Subjective:    Patient ID: [name], female    DOB: 13-Oct-1920, 78 y.o.   MRN: [id number]  HPI Here for follow up Reviewed thyroid med and refilled  No falls Still not happy about her limitations and all Stiff in AM and hip and back pain---usually works out over time Hasn't needed the tramadol of late  Not anhedonic---finds things to enjoy on a daily basis (esp great grandchildren)  No palpitations Does note some skip beats if she takes her pulse  No chest pain No sig SOB Still not willing to stop smoking No dizziness or syncope No sig edema  Current Outpatient Prescriptions on File Prior to Visit  Medication Sig Dispense Refill  . amLODipine (NORVASC) 5 MG tablet TAKE 1 TABLET EVERY DAY  30 tablet  1  . aspirin EC 81 MG tablet Take 81 mg by mouth daily.      . cholecalciferol (VITAMIN D) 1000 UNITS tablet Take 1,000 Units by mouth daily.        . metoprolol tartrate (LOPRESSOR) 25 MG tablet TAKE 1/2 TABLET BY MOUTH TWICE A DAY  30 tablet  11  . ramipril (ALTACE) 10 MG capsule Take 1 capsule (10 mg total) by mouth daily.  30 capsule  12  . traMADol (ULTRAM) 50 MG tablet TAKE 1/2 TO 1 TABLET 3 TIMES A DAY AS NEEDED FOR BAD ATHRITIS PAIN  90 tablet  0  . [DISCONTINUED] propylthiouracil (PTU) 50 MG tablet TAKE 3 TABLETS 2 TIMES A DAY  180 tablet  11    Allergies  Allergen Reactions  . Methimazole     REACTION: increased LFT's    Past Medical History  Diagnosis Date  . Hypertension   . Thyroid disease   . Arthritis   . Hyperlipidemia   . Osteoporosis   . Right ventricular thrombus   . Cataract     Past Surgical History  Procedure Date  . Mastoidectomy 1932    Family History  Problem Relation Age of Onset  . Cancer Mother     pancreatic cancer  . COPD Father   . Diabetes Sister   . Coronary artery disease Neg Hx     History   Social History  . Marital Status: Widowed    Spouse Name: N/A    Number of Children: 4  . Years of Education: N/A    Occupational History  . homemaker    Social History Main Topics  . Smoking status: Current Some Day Smoker  . Smokeless tobacco: Never Used     Comment: smokes when stressed  . Alcohol Use: Yes     Comment: wine and burbon  . Drug Use: Not on file  . Sexually Active: Not on file   Other Topics Concern  . 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 Son, Molly Maduro to make health care decisions. Requests DNR order--she has been familiar with this. No tube feeds    Review of Systems Appetite is fair Weight is up 3#--has increased fat in diet    Objective:   Physical Exam  Constitutional: She appears well-developed and well-nourished. No distress.  Neck: Normal range of motion. Neck supple.       Nodular thyroid that is only slightly enlarged  Cardiovascular: Normal rate, regular rhythm and normal heart sounds.  Exam reveals no gallop.   No murmur heard. Pulmonary/Chest: Effort normal and breath sounds normal. No respiratory distress. She has no wheezes. She has no rales.  Abdominal: Soft. There is no tenderness.  Musculoskeletal: She exhibits no edema and no tenderness.  Lymphadenopathy:    She has no cervical adenopathy.  Psychiatric: She has a normal mood and affect. Her behavior is normal.          Assessment & Plan:

## 2012-07-26 NOTE — Assessment & Plan Note (Signed)
Mostly pain and stiffness in AM Hasn't really needed meds

## 2012-07-26 NOTE — Assessment & Plan Note (Signed)
Only with the hyperthyroidism

## 2012-08-08 ENCOUNTER — Other Ambulatory Visit: Payer: Self-pay | Admitting: Internal Medicine

## 2012-10-01 ENCOUNTER — Other Ambulatory Visit: Payer: Self-pay | Admitting: Internal Medicine

## 2013-02-16 ENCOUNTER — Encounter: Payer: Medicare Other | Admitting: Internal Medicine

## 2013-02-20 ENCOUNTER — Encounter: Payer: Medicare Other | Admitting: Internal Medicine

## 2013-02-27 ENCOUNTER — Ambulatory Visit (INDEPENDENT_AMBULATORY_CARE_PROVIDER_SITE_OTHER): Payer: Medicare Other | Admitting: Internal Medicine

## 2013-02-27 ENCOUNTER — Encounter: Payer: Self-pay | Admitting: Internal Medicine

## 2013-02-27 VITALS — BP 120/80 | HR 78 | Temp 97.9°F | Wt 102.0 lb

## 2013-02-27 DIAGNOSIS — I10 Essential (primary) hypertension: Secondary | ICD-10-CM

## 2013-02-27 DIAGNOSIS — R002 Palpitations: Secondary | ICD-10-CM

## 2013-02-27 DIAGNOSIS — Z0000 Encounter for general adult medical examination without abnormal findings: Secondary | ICD-10-CM

## 2013-02-27 DIAGNOSIS — E0590 Thyrotoxicosis, unspecified without thyrotoxic crisis or storm: Secondary | ICD-10-CM

## 2013-02-27 LAB — HEPATIC FUNCTION PANEL
ALT: 23 U/L (ref 0–35)
AST: 24 U/L (ref 0–37)
Albumin: 4.2 g/dL (ref 3.5–5.2)
Alkaline Phosphatase: 76 U/L (ref 39–117)
Total Bilirubin: 0.6 mg/dL (ref 0.3–1.2)

## 2013-02-27 LAB — BASIC METABOLIC PANEL
Chloride: 103 mEq/L (ref 96–112)
GFR: 73.82 mL/min (ref >60.00)
Potassium: 4.9 mEq/L (ref 3.5–5.1)
Sodium: 140 mEq/L (ref 135–145)

## 2013-02-27 LAB — CBC WITH DIFFERENTIAL/PLATELET
Basophils Relative: 0.5 % (ref 0.0–3.0)
Eosinophils Relative: 0.9 % (ref 0.0–5.0)
HCT: 47.2 % — ABNORMAL HIGH (ref 36.0–46.0)
Lymphs Abs: 1.1 10*3/uL (ref 0.7–4.0)
MCV: 90.5 fl (ref 78.0–100.0)
Monocytes Absolute: 0.7 10*3/uL (ref 0.1–1.0)
Monocytes Relative: 8 % (ref 3.0–12.0)
Platelets: 190 10*3/uL (ref 150.0–400.0)
RBC: 5.21 Mil/uL — ABNORMAL HIGH (ref 3.87–5.11)
WBC: 8.6 10*3/uL (ref 4.5–10.5)

## 2013-02-27 LAB — T4, FREE: Free T4: 0.91 ng/dL (ref 0.60–1.60)

## 2013-02-27 LAB — TSH: TSH: 1.19 u[IU]/mL (ref 0.35–5.50)

## 2013-02-27 NOTE — Assessment & Plan Note (Signed)
Has lost some weight Otherwise seems euthyroid Will check labs on her PTU

## 2013-02-27 NOTE — Progress Notes (Signed)
Subjective:    Patient ID: [name], female    DOB: [birth year], 79 y.o.   MRN: [id number]  HPI Here for physical  Moved to a villa with 2 bedrooms Much more space--- lost weight with the move and has more space to manage Moved because it became available and has space for family to visit now Now with 3 great grandchildren! "this makes living this long worthwhile"  Noted some swelling in left foot about a week ago Better the next morning--- has tried to keep off her feet to some degree and it has stayed down  Still smokes intermittently  Notes heart skipping at times It seems slow at times Only if sitting and actually checking her pulse  "Not as sure on my feet as I used to be" Careful with turns, gets up slowly,  etc No falls Still drives locally Does instrumental ADLs  Current Outpatient Prescriptions on File Prior to Visit  Medication Sig Dispense Refill  . amLODipine (NORVASC) 5 MG tablet TAKE 1 TABLET EVERY DAY  90 tablet  3  . aspirin EC 81 MG tablet Take 81 mg by mouth daily.      . cholecalciferol (VITAMIN D) 1000 UNITS tablet Take 1,000 Units by mouth daily.        . metoprolol tartrate (LOPRESSOR) 25 MG tablet TAKE 1/2 TABLET BY MOUTH TWICE A DAY  30 tablet  11  . propylthiouracil (PTU) 50 MG tablet Take 3 tablets (150 mg total) by mouth daily.  90 tablet  11  . ramipril (ALTACE) 10 MG capsule Take 1 capsule (10 mg total) by mouth daily.  30 capsule  12  . traMADol (ULTRAM) 50 MG tablet TAKE 1/2 TO 1 TABLET 3 TIMES A DAY AS NEEDED FOR BAD ATHRITIS PAIN  90 tablet  0   No current facility-administered medications on file prior to visit.    Allergies  Allergen Reactions  . Methimazole     REACTION: increased LFT's    Past Medical History  Diagnosis Date  . Hypertension   . Arthritis   . Hyperlipidemia   . Osteoporosis   . Right ventricular thrombus   . Cataract   . Hyperthyroidism     Past Surgical History  Procedure Laterality Date  .  Mastoidectomy  1932    Family History  Problem Relation Age of Onset  . Cancer Mother     pancreatic cancer  . COPD Father   . Diabetes Sister   . Coronary artery disease Neg Hx     History   Social History  . Marital Status: Widowed    Spouse Name: N/A    Number of Children: 4  . Years of Education: N/A   Occupational History  . homemaker    Social History Main Topics  . Smoking status: Current Some Day Smoker  . Smokeless tobacco: Never Used     Comment: smokes when stressed  . Alcohol Use: Yes     Comment: wine and burbon  . Drug Use: Not on file  . Sexually Active: Not on file   Other Topics Concern  . 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 Son, Molly Maduro to make health care decisions. Requests DNR order--she has been familiar  with this. No tube feeds   Review of Systems  Constitutional: Positive for unexpected weight change. Negative for fatigue.       Lost a few pounds with the stress of the move Wears seat belt  HENT: Positive for hearing loss, rhinorrhea and tinnitus. Negative for dental problem.   Eyes:       Uses glasses for driving and reading Vision is better with them Had diplopia during driving--- saw Dr Oren Bracket about this and glasses got rid of it  Respiratory: Positive for shortness of breath. Negative for cough and chest tightness.        Stable DOE  Cardiovascular: Positive for palpitations and leg swelling. Negative for chest pain.  Gastrointestinal: Negative for nausea, vomiting and blood in stool.       No heartburn Rare spells of diarrhea-relates to seasonal fruit  Genitourinary: Negative for difficulty urinating.       Occasional urge incontinence--doesn't need protection  Musculoskeletal: Positive for back pain and  arthralgias. Negative for joint swelling.       Uses rare ibuprofen--like 200mg  once a week Stiff in AM but usually loosens up without meds  Skin: Negative for rash.       No suspicious lesions  Allergic/Immunologic: Negative for environmental allergies and immunocompromised state.  Neurological: Negative for dizziness, syncope and light-headedness.  Psychiatric/Behavioral: Positive for sleep disturbance. Negative for dysphoric mood. The patient is not nervous/anxious.        Variable sleep --occ restless nights        Objective:   Physical Exam  Constitutional: She appears well-developed. No distress.  HENT:  Mouth/Throat: Oropharynx is clear and moist. No oropharyngeal exudate.  Eyes: Conjunctivae and EOM are normal. Pupils are equal, round, and reactive to light.  Neck: Normal range of motion. Neck supple. No thyromegaly present.  Cardiovascular: Normal rate, regular rhythm and normal heart sounds.  Exam reveals no gallop.   No murmur heard. Pulmonary/Chest: Effort normal and breath sounds normal. No respiratory distress. She has no wheezes. She has no rales.  Abdominal: Soft. There is no tenderness.  Musculoskeletal: She exhibits no edema and no tenderness.  Lymphadenopathy:    She has no cervical adenopathy.  Skin: No rash noted. No erythema.  Psychiatric: She has a normal mood and affect. Her behavior is normal.          Assessment & Plan:

## 2013-02-27 NOTE — Assessment & Plan Note (Signed)
BP Readings from Last 3 Encounters:  02/27/13 120/80  07/26/12 140/80  01/25/12 128/80   Good control Due for labs

## 2013-02-27 NOTE — Assessment & Plan Note (Signed)
Cancer screening not appropriate Prefers no tetanus shot

## 2013-02-27 NOTE — Assessment & Plan Note (Signed)
Regular but with frequent extra beats Doesn't sound worrisome (she only notes when she is checking her pulse)

## 2013-02-28 ENCOUNTER — Encounter: Payer: Self-pay | Admitting: *Deleted

## 2013-06-26 ENCOUNTER — Other Ambulatory Visit: Payer: Self-pay | Admitting: *Deleted

## 2013-06-26 MED ORDER — RAMIPRIL 10 MG PO CAPS: 10.0000 mg | ORAL_CAPSULE | Freq: Every day | ORAL | Status: DC

## 2013-07-12 ENCOUNTER — Other Ambulatory Visit: Payer: Self-pay

## 2013-07-22 ENCOUNTER — Other Ambulatory Visit: Payer: Self-pay | Admitting: Internal Medicine

## 2013-07-24 ENCOUNTER — Other Ambulatory Visit: Payer: Self-pay | Admitting: Internal Medicine

## 2013-07-24 NOTE — Telephone Encounter (Signed)
Sent 07/23/13

## 2013-08-10 ENCOUNTER — Ambulatory Visit (INDEPENDENT_AMBULATORY_CARE_PROVIDER_SITE_OTHER): Payer: Medicare Other | Admitting: Internal Medicine

## 2013-08-10 ENCOUNTER — Encounter: Payer: Self-pay | Admitting: Internal Medicine

## 2013-08-10 VITALS — BP 122/86 | HR 98 | Temp 97.3°F | Wt 100.8 lb

## 2013-08-10 DIAGNOSIS — I10 Essential (primary) hypertension: Secondary | ICD-10-CM

## 2013-08-10 DIAGNOSIS — I4891 Unspecified atrial fibrillation: Secondary | ICD-10-CM

## 2013-08-10 DIAGNOSIS — E0590 Thyrotoxicosis, unspecified without thyrotoxic crisis or storm: Secondary | ICD-10-CM

## 2013-08-10 DIAGNOSIS — J019 Acute sinusitis, unspecified: Secondary | ICD-10-CM | POA: Insufficient documentation

## 2013-08-10 LAB — CBC WITH DIFFERENTIAL/PLATELET
Basophils Relative: 0.3 % (ref 0.0–3.0)
Eosinophils Relative: 0.5 % (ref 0.0–5.0)
HCT: 45 % (ref 36.0–46.0)
Lymphocytes Relative: 8.4 % — ABNORMAL LOW (ref 12.0–46.0)
MCV: 88.2 fl (ref 78.0–100.0)
Monocytes Absolute: 0.9 10*3/uL (ref 0.1–1.0)
Monocytes Relative: 9 % (ref 3.0–12.0)
Neutro Abs: 8.5 10*3/uL — ABNORMAL HIGH (ref 1.4–7.7)
Neutrophils Relative %: 81.8 % — ABNORMAL HIGH (ref 43.0–77.0)
RBC: 5.1 Mil/uL (ref 3.87–5.11)
WBC: 10.4 10*3/uL (ref 4.5–10.5)

## 2013-08-10 LAB — T4, FREE: Free T4: 1.14 ng/dL (ref 0.60–1.60)

## 2013-08-10 LAB — BASIC METABOLIC PANEL
BUN: 13 mg/dL (ref 6–23)
Calcium: 9.6 mg/dL (ref 8.4–10.5)
Creatinine, Ser: 0.8 mg/dL (ref 0.4–1.2)
GFR: 77.16 mL/min (ref >60.00)
Glucose, Bld: 101 mg/dL — ABNORMAL HIGH (ref 70–99)

## 2013-08-10 LAB — HEPATIC FUNCTION PANEL
ALT: 19 U/L (ref 0–35)
AST: 21 U/L (ref 0–37)
Albumin: 3.6 g/dL (ref 3.5–5.2)
Alkaline Phosphatase: 78 U/L (ref 39–117)
Bilirubin, Direct: 0 mg/dL (ref 0.0–0.3)
Total Protein: 7.3 g/dL (ref 6.0–8.3)

## 2013-08-10 LAB — TSH: TSH: 1.19 u[IU]/mL (ref 0.35–5.50)

## 2013-08-10 MED ORDER — AMOXICILLIN 500 MG PO TABS: 1000.0000 mg | ORAL_TABLET | Freq: Two times a day (BID) | ORAL | Status: DC

## 2013-08-10 NOTE — Assessment & Plan Note (Signed)
BP Readings from Last 3 Encounters:  08/10/13 122/86  02/27/13 120/80  07/26/12 140/80   Good control No changes

## 2013-08-10 NOTE — Assessment & Plan Note (Signed)
Not getting better after 10 days Will give amoxil

## 2013-08-10 NOTE — Progress Notes (Signed)
Pre-visit discussion using our clinic review tool. No additional management support is needed unless otherwise documented below in the visit note.  

## 2013-08-10 NOTE — Assessment & Plan Note (Signed)
Controlled on the PTU Due for labs

## 2013-08-10 NOTE — Assessment & Plan Note (Signed)
Only when hyperthyroid Only on aspirin since no apparent recurrence

## 2013-08-10 NOTE — Progress Notes (Signed)
Subjective:    Patient ID: [name], female    DOB: [birth year], 79 y.o.   MRN: [id number]  HPI Feels sick "Bad cold" for past 8-9 days Sore throat at first Now with cough and thick nasal discharge and drainage Thick sputum from the PND No ear pain Some fever in evenings and night---uses 2 aspirin Had some sweats at night No clear SOB---not very active Only taking effervescent cold tabs---does help some  Otherwise doing okay Same disabilities Son helps her--shopping for her, etc  Still notes her heart "jumping"----skips at times No chest pain No edema  Appetite is not great Weight down a pound She eats because "I have to"  Current Outpatient Prescriptions on File Prior to Visit  Medication Sig Dispense Refill  . amLODipine (NORVASC) 5 MG tablet TAKE 1 TABLET EVERY DAY  90 tablet  3  . aspirin EC 81 MG tablet Take 81 mg by mouth daily.      . cholecalciferol (VITAMIN D) 1000 UNITS tablet Take 1,000 Units by mouth daily.        . metoprolol tartrate (LOPRESSOR) 25 MG tablet TAKE 1/2 TABLET BY MOUTH TWICE A DAY  30 tablet  11  . propylthiouracil (PTU) 50 MG tablet TAKE 3 TABLETS (150 MG TOTAL) BY MOUTH DAILY.  90 tablet  11  . ramipril (ALTACE) 10 MG capsule Take 1 capsule (10 mg total) by mouth daily.  90 capsule  3  . traMADol (ULTRAM) 50 MG tablet TAKE 1/2 TO 1 TABLET 3 TIMES A DAY AS NEEDED FOR BAD ATHRITIS PAIN  90 tablet  0   No current facility-administered medications on file prior to visit.    Allergies  Allergen Reactions  . Methimazole     REACTION: increased LFT's    Past Medical History  Diagnosis Date  . Hypertension   . Arthritis   . Hyperlipidemia   . Osteoporosis   . Right ventricular thrombus   . Cataract   . Hyperthyroidism     Past Surgical History  Procedure Laterality Date  . Mastoidectomy  1932    Family History  Problem Relation Age of Onset  . Cancer Mother     pancreatic cancer  . COPD Father   . Diabetes Sister   .  Coronary artery disease Neg Hx     History   Social History  . Marital Status: Widowed    Spouse Name: N/A    Number of Children: 4  . Years of Education: N/A   Occupational History  . homemaker    Social History Main Topics  . Smoking status: Current Some Day Smoker  . Smokeless tobacco: Never Used     Comment: smokes when stressed  . Alcohol Use: Yes     Comment: wine and burbon  . Drug Use: Not on file  . Sexual Activity: Not on file   Other Topics Concern  . 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 Son, Molly Maduro to make health care decisions. Requests DNR order--she has been familiar with this. No tube feeds   Review of Systems Hearing is worse over past 6 months. Has had it checked--nothing on left and down on right Has hearing aide Broke left 5th toe recently (  she can tell by the pain)    Objective:   Physical Exam  Constitutional: She appears well-developed and well-nourished. No distress.  HENT:  Mouth/Throat: Oropharynx is clear and moist. No oropharyngeal exudate.  No sinus tenderness Moderate nasal inflammation   Neck: Normal range of motion. Neck supple. No thyromegaly present.  Cardiovascular: Normal rate and normal heart sounds.  Exam reveals no gallop.   No murmur heard. freq extra beats  Pulmonary/Chest: Effort normal and breath sounds normal. No respiratory distress. She has no wheezes. She has no rales.  rattley cough  Musculoskeletal: She exhibits no edema and no tenderness.  Lymphadenopathy:    She has no cervical adenopathy.  Psychiatric: She has a normal mood and affect. Her behavior is normal.          Assessment & Plan:

## 2013-08-10 NOTE — Patient Instructions (Signed)
Please start the amoxicillin antibiotic. Let me know next week if you are not feeling better

## 2013-08-13 ENCOUNTER — Encounter: Payer: Self-pay | Admitting: Internal Medicine

## 2013-08-13 ENCOUNTER — Ambulatory Visit: Payer: Medicare Other | Admitting: Internal Medicine

## 2013-08-21 ENCOUNTER — Ambulatory Visit (INDEPENDENT_AMBULATORY_CARE_PROVIDER_SITE_OTHER): Payer: Medicare Other | Admitting: Internal Medicine

## 2013-08-21 ENCOUNTER — Ambulatory Visit (INDEPENDENT_AMBULATORY_CARE_PROVIDER_SITE_OTHER)
Admission: RE | Admit: 2013-08-21 | Discharge: 2013-08-21 | Disposition: A | Discharge status: Home / Self Care | Payer: Medicare Other | Source: Ambulatory Visit | Attending: Internal Medicine | Admitting: Internal Medicine

## 2013-08-21 ENCOUNTER — Encounter: Payer: Self-pay | Admitting: Internal Medicine

## 2013-08-21 VITALS — BP 128/78 | HR 79 | Temp 97.8°F | Wt 101.0 lb

## 2013-08-21 DIAGNOSIS — R0602 Shortness of breath: Secondary | ICD-10-CM

## 2013-08-21 NOTE — Progress Notes (Signed)
Pre-visit discussion using our clinic review tool. No additional management support is needed unless otherwise documented below in the visit note.  

## 2013-08-21 NOTE — Assessment & Plan Note (Signed)
Vague May just not be completely better from past infection CXR looks okay--- ?suspicious area on right Will Rx with levaquin if positive Otherwise, son will get her a humidifier

## 2013-08-21 NOTE — Progress Notes (Signed)
Subjective:    Patient ID: [name], female    DOB: [birth year], 79 y.o.   MRN: [id number]  HPI Here with son Nadine Counts  Didn't feel right on the amoxicillin Felt her throat and "everything" dried up Stopped it for a couple of days but then restarted---only took it for 6 days Sinus symptoms are better No drainage Only has dry cough  Now she doesn't "feel as good as if I didn't go through this cold" Doesn't have normal energy levels Tired--no energy to walk even in house (but does) Still has a dry cough Some sweats at night--- around neck Some sense of shortness of breath  No meds for this No humidifier  Current Outpatient Prescriptions on File Prior to Visit  Medication Sig Dispense Refill  . amLODipine (NORVASC) 5 MG tablet TAKE 1 TABLET EVERY DAY  90 tablet  3  . aspirin EC 81 MG tablet Take 81 mg by mouth daily.      . cholecalciferol (VITAMIN D) 1000 UNITS tablet Take 1,000 Units by mouth daily.        . metoprolol tartrate (LOPRESSOR) 25 MG tablet TAKE 1/2 TABLET BY MOUTH TWICE A DAY  30 tablet  11  . propylthiouracil (PTU) 50 MG tablet TAKE 3 TABLETS (150 MG TOTAL) BY MOUTH DAILY.  90 tablet  11  . ramipril (ALTACE) 10 MG capsule Take 1 capsule (10 mg total) by mouth daily.  90 capsule  3  . traMADol (ULTRAM) 50 MG tablet TAKE 1/2 TO 1 TABLET 3 TIMES A DAY AS NEEDED FOR BAD ATHRITIS PAIN  90 tablet  0   No current facility-administered medications on file prior to visit.    Allergies  Allergen Reactions  . Methimazole     REACTION: increased LFT's    Past Medical History  Diagnosis Date  . Hypertension   . Arthritis   . Hyperlipidemia   . Osteoporosis   . Right ventricular thrombus   . Cataract   . Hyperthyroidism   . Atrial fibrillation     only with hyperthyroidism    Past Surgical History  Procedure Laterality Date  . Mastoidectomy  1932    Family History  Problem Relation Age of Onset  . Cancer Mother     pancreatic cancer  . COPD Father     . Diabetes Sister   . Coronary artery disease Neg Hx     History   Social History  . Marital Status: Widowed    Spouse Name: N/A    Number of Children: 4  . Years of Education: N/A   Occupational History  . homemaker    Social History Main Topics  . Smoking status: Current Some Day Smoker  . Smokeless tobacco: Never Used     Comment: smokes when stressed  . Alcohol Use: Yes     Comment: wine and burbon  . Drug Use: Not on file  . Sexual Activity: Not on file   Other Topics Concern  . 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 Son, Molly Maduro to make health care decisions. Requests DNR order--she has been familiar with this. No tube feeds   Review of Systems Eating okay though her appetite isn't good Sleep is disturbed by  sweats and need to use bathroom No fever     Objective:   Physical Exam  Constitutional: She appears well-developed. No distress.  HENT:  Nose: Nose normal.  Mouth/Throat: Oropharynx is clear and moist. No oropharyngeal exudate.  Neck: Normal range of motion. Neck supple. No thyromegaly present.  Pulmonary/Chest: Effort normal. No respiratory distress. She has no wheezes. She has rales.  Slight LLL crackles  Lymphadenopathy:    She has no cervical adenopathy.          Assessment & Plan:

## 2013-08-23 ENCOUNTER — Other Ambulatory Visit: Payer: Self-pay | Admitting: Internal Medicine

## 2013-09-24 ENCOUNTER — Other Ambulatory Visit: Payer: Self-pay | Admitting: *Deleted

## 2013-09-24 MED ORDER — METOPROLOL TARTRATE 25 MG PO TABS: ORAL_TABLET | ORAL | Status: DC

## 2013-09-24 NOTE — Telephone Encounter (Signed)
Received faxed refill request from pharmacy. Refill sent to pharmacy electronically. 

## 2013-12-05 DIAGNOSIS — S329XXA Fracture of unspecified parts of lumbosacral spine and pelvis, initial encounter for closed fracture: Secondary | ICD-10-CM

## 2013-12-05 HISTORY — DX: Fracture of unspecified parts of lumbosacral spine and pelvis, initial encounter for closed fracture: S32.9XXA

## 2013-12-06 ENCOUNTER — Ambulatory Visit (INDEPENDENT_AMBULATORY_CARE_PROVIDER_SITE_OTHER): Payer: Medicare Other | Admitting: Internal Medicine

## 2013-12-06 ENCOUNTER — Encounter: Payer: Self-pay | Admitting: Internal Medicine

## 2013-12-06 VITALS — BP 122/82 | HR 99 | Temp 97.5°F | Wt 99.0 lb

## 2013-12-06 DIAGNOSIS — B029 Zoster without complications: Secondary | ICD-10-CM

## 2013-12-06 MED ORDER — VALACYCLOVIR HCL 1 G PO TABS: 1000.0000 mg | ORAL_TABLET | Freq: Three times a day (TID) | ORAL | Status: DC

## 2013-12-06 MED ORDER — GABAPENTIN 100 MG PO CAPS: 100.0000 mg | ORAL_CAPSULE | Freq: Every day | ORAL | Status: DC

## 2013-12-06 NOTE — Patient Instructions (Addendum)
Shingles Shingles (herpes zoster) is an infection that is caused by the same virus that causes chickenpox (varicella). The infection causes a painful skin rash and fluid-filled blisters, which eventually break open, crust over, and heal. It may occur in any area of the body, but it usually affects only one side of the body or face. The pain of shingles usually lasts about 1 month. However, some people with shingles may develop long-term (chronic) pain in the affected area of the body. Shingles often occurs many years after the person had chickenpox. It is more common:  In people older than 50 years.  In people with weakened immune systems, such as those with HIV, AIDS, or cancer.  In people taking medicines that weaken the immune system, such as transplant medicines.  In people under great stress. CAUSES  Shingles is caused by the varicella zoster virus (VZV), which also causes chickenpox. After a person is infected with the virus, it can remain in the person's body for years in an inactive state (dormant). To cause shingles, the virus reactivates and breaks out as an infection in a nerve root. The virus can be spread from person to person (contagious) through contact with open blisters of the shingles rash. It will only spread to people who have not had chickenpox. When these people are exposed to the virus, they may develop chickenpox. They will not develop shingles. Once the blisters scab over, the person is no longer contagious and cannot spread the virus to others. SYMPTOMS  Shingles shows up in stages. The initial symptoms may be pain, itching, and tingling in an area of the skin. This pain is usually described as burning, stabbing, or throbbing.In a few days or weeks, a painful red rash will appear in the area where the pain, itching, and tingling were felt. The rash is usually on one side of the body in a band or belt-like pattern. Then, the rash usually turns into fluid-filled blisters. They  will scab over and dry up in approximately 2 3 weeks. Flu-like symptoms may also occur with the initial symptoms, the rash, or the blisters. These may include:  Fever.  Chills.  Headache.  Upset stomach. DIAGNOSIS  Your caregiver will perform a skin exam to diagnose shingles. Skin scrapings or fluid samples may also be taken from the blisters. This sample will be examined under a microscope or sent to a lab for further testing. TREATMENT  There is no specific cure for shingles. Your caregiver will likely prescribe medicines to help you manage the pain, recover faster, and avoid long-term problems. This may include antiviral drugs, anti-inflammatory drugs, and pain medicines. HOME CARE INSTRUCTIONS   Take a cool bath or apply cool compresses to the area of the rash or blisters as directed. This may help with the pain and itching.   Only take over-the-counter or prescription medicines as directed by your caregiver.   Rest as directed by your caregiver.  Keep your rash and blisters clean with mild soap and cool water or as directed by your caregiver.  Do not pick your blisters or scratch your rash. Apply an anti-itch cream or numbing creams to the affected area as directed by your caregiver.  Keep your shingles rash covered with a loose bandage (dressing).  Avoid skin contact with:  Babies.   Pregnant women.   Children with eczema.   Elderly people with transplants.   People with chronic illnesses, such as leukemia or AIDS.   Wear loose-fitting clothing to help ease   the pain of material rubbing against the rash.  Keep all follow-up appointments with your caregiver.If the area involved is on your face, you may receive a referral for follow-up to a specialist, such as an eye doctor (ophthalmologist) or an ear, nose, and throat (ENT) doctor. Keeping all follow-up appointments will help you avoid eye complications, chronic pain, or disability.  SEEK IMMEDIATE MEDICAL  CARE IF:   You have facial pain, pain around the eye area, or loss of feeling on one side of your face.  You have ear pain or ringing in your ear.  You have loss of taste.  Your pain is not relieved with prescribed medicines.   Your redness or swelling spreads.   You have more pain and swelling.  Your condition is worsening or has changed.   You have a feveror persistent symptoms for more than 2 3 days.  You have a fever and your symptoms suddenly get worse. MAKE SURE YOU:  Understand these instructions.  Will watch your condition.  Will get help right away if you are not doing well or get worse. Document Released: 08/23/2005 Document Revised: 05/17/2012 Document Reviewed: 04/06/2012 ExitCare Patient Information 2014 [employer].  

## 2013-12-06 NOTE — Progress Notes (Signed)
Subjective:    Patient ID: [name], female    DOB: 06/05/1921, 80 y.o.   MRN: [id number]  HPI  Pt presents to the clinic today with c/o a rash. The rash is on her chest. She noticed this 2 days ago. She reports that it does itch and is tender at times.  Review of Systems      Past Medical History  Diagnosis Date  . Hypertension   . Arthritis   . Hyperlipidemia   . Osteoporosis   . Right ventricular thrombus   . Cataract   . Hyperthyroidism   . Atrial fibrillation     only with hyperthyroidism    Current Outpatient Prescriptions  Medication Sig Dispense Refill  . amLODipine (NORVASC) 5 MG tablet TAKE 1 TABLET BY MOUTH EVERY DAY  90 tablet  1  . aspirin EC 81 MG tablet Take 81 mg by mouth daily.      . cholecalciferol (VITAMIN D) 1000 UNITS tablet Take 1,000 Units by mouth daily.        . metoprolol tartrate (LOPRESSOR) 25 MG tablet TAKE 1/2 TABLET BY MOUTH TWICE A DAY  30 tablet  5  . propylthiouracil (PTU) 50 MG tablet TAKE 3 TABLETS (150 MG TOTAL) BY MOUTH DAILY.  90 tablet  11  . ramipril (ALTACE) 10 MG capsule Take 1 capsule (10 mg total) by mouth daily.  90 capsule  3  . traMADol (ULTRAM) 50 MG tablet TAKE 1/2 TO 1 TABLET 3 TIMES A DAY AS NEEDED FOR BAD ATHRITIS PAIN  90 tablet  0   No current facility-administered medications for this visit.    Allergies  Allergen Reactions  . Methimazole     REACTION: increased LFT's    Family History  Problem Relation Age of Onset  . Cancer Mother     pancreatic cancer  . COPD Father   . Diabetes Sister   . Coronary artery disease Neg Hx     History   Social History  . Marital Status: Widowed    Spouse Name: N/A    Number of Children: 4  . Years of Education: N/A   Occupational History  . homemaker    Social History Main Topics  . Smoking status: Current Some Day Smoker  . Smokeless tobacco: Never Used     Comment: smokes when stressed  . Alcohol Use: Yes     Comment: wine and burbon  . Drug Use:  Not on file  . Sexual Activity: Not on file   Other Topics Concern  . 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 Son, Herbie Baltimore to make health care decisions. Requests DNR order--she has been familiar with this. No tube feeds     Constitutional: Denies fever, malaise, fatigue, headache or abrupt weight changes. .  Skin: Pt reports rash. Denies redness, lesions or ulcercations.     No other specific complaints in a complete review of systems (except as listed in HPI above).  Objective:   Physical Exam  BP 122/82  Pulse 99  Temp(Src) 97.5 F (36.4 C) (Oral)  Wt 99 lb (44.906 kg)  SpO2 94% Wt Readings from Last 3 Encounters:  12/06/13 99 lb (44.906 kg)  08/21/13 101 lb (45.813 kg)  08/10/13 100 lb 12 oz (45.7 kg)    General: Appears her stated age,  in NAD. Skin: Warm, dry and intact. Vesicular rash on erythematous base noted starting on left neck, extending down to left shoulder and chest.   BMET    Component Value Date/Time   NA 136 08/10/2013 1248   K 4.0 08/10/2013 1248   CL 99 08/10/2013 1248   CO2 28 08/10/2013 1248   GLUCOSE 101* 08/10/2013 1248   BUN 13 08/10/2013 1248   CREATININE 0.8 08/10/2013 1248   CALCIUM 9.6 08/10/2013 1248   GFRNONAA 67.27 06/01/2010 1256   GFRAA  Value: >60        The eGFR has been calculated using the MDRD equation. This calculation has not been validated in all clinical situations. eGFR's persistently <60 mL/min signify possible Chronic Kidney Disease. 09/20/2008 0445    Lipid Panel  No results found for this basename: chol, trig, hdl, cholhdl, vldl, ldlcalc    CBC    Component Value Date/Time   WBC 10.4 08/10/2013 1248   RBC 5.10 08/10/2013 1248   HGB 14.8 08/10/2013 1248   HCT 45.0 08/10/2013 1248   PLT 268.0  08/10/2013 1248   MCV 88.2 08/10/2013 1248   MCHC 32.9 08/10/2013 1248   RDW 14.2 08/10/2013 1248   LYMPHSABS 0.9 08/10/2013 1248   MONOABS 0.9 08/10/2013 1248   EOSABS 0.1 08/10/2013 1248   BASOSABS 0.0 08/10/2013 1248    Hgb A1C No results found for this basename: HGBA1C         Assessment & Plan:   Rash d/t shingles:  eRx for Valacyclovir TID x 10 days eRx for Neurontin at night  RTC as needed or if symptoms persist or worsen

## 2013-12-06 NOTE — Progress Notes (Signed)
Pre visit review using our clinic review tool, if applicable. No additional management support is needed unless otherwise documented below in the visit note. 

## 2013-12-09 ENCOUNTER — Emergency Department: Payer: Self-pay | Admitting: Emergency Medicine

## 2013-12-09 LAB — URINALYSIS, COMPLETE
Bacteria: NONE SEEN
Bilirubin,UR: NEGATIVE
Blood: NEGATIVE
KETONE: NEGATIVE
LEUKOCYTE ESTERASE: NEGATIVE
NITRITE: NEGATIVE
PH: 5 (ref 4.5–8.0)
PROTEIN: NEGATIVE
RBC,UR: 1 /HPF (ref 0–5)
SPECIFIC GRAVITY: 1.01 (ref 1.003–1.030)
Squamous Epithelial: <1
WBC UR: 3 /HPF (ref 0–5)

## 2013-12-09 LAB — CBC
HCT: 47 % (ref 35.0–47.0)
HGB: 15.3 g/dL (ref 12.0–16.0)
MCH: 29.1 pg (ref 26.0–34.0)
MCHC: 32.5 g/dL (ref 32.0–36.0)
MCV: 90 fL (ref 80–100)
PLATELETS: 135 10*3/uL — AB (ref 150–440)
RBC: 5.24 10*6/uL — ABNORMAL HIGH (ref 3.80–5.20)
RDW: 14 % (ref 11.5–14.5)
WBC: 10.8 10*3/uL (ref 3.6–11.0)

## 2013-12-09 LAB — COMPREHENSIVE METABOLIC PANEL
ALK PHOS: 86 U/L
ANION GAP: 7 (ref 7–16)
AST: 36 U/L (ref 15–37)
Albumin: 3.6 g/dL (ref 3.4–5.0)
BUN: 21 mg/dL — ABNORMAL HIGH (ref 7–18)
Bilirubin,Total: 0.4 mg/dL (ref 0.2–1.0)
CALCIUM: 8.8 mg/dL (ref 8.5–10.1)
CO2: 25 mmol/L (ref 21–32)
Chloride: 107 mmol/L (ref 98–107)
Creatinine: 0.96 mg/dL (ref 0.60–1.30)
EGFR (African American): >60
GFR CALC NON AF AMER: 52 — AB
GLUCOSE: 108 mg/dL — AB (ref 65–99)
Osmolality: 281 (ref 275–301)
POTASSIUM: 3.8 mmol/L (ref 3.5–5.1)
SGPT (ALT): 38 U/L (ref 12–78)
Sodium: 139 mmol/L (ref 136–145)
TOTAL PROTEIN: 7.3 g/dL (ref 6.4–8.2)

## 2013-12-09 LAB — TROPONIN I: Troponin-I: <0.02 ng/mL

## 2013-12-10 ENCOUNTER — Telehealth: Payer: Self-pay | Admitting: Internal Medicine

## 2013-12-10 NOTE — Telephone Encounter (Signed)
Relevant patient education mailed to patient.  

## 2013-12-11 DIAGNOSIS — B029 Zoster without complications: Secondary | ICD-10-CM

## 2013-12-11 DIAGNOSIS — S32509A Unspecified fracture of unspecified pubis, initial encounter for closed fracture: Secondary | ICD-10-CM

## 2013-12-11 DIAGNOSIS — E0590 Thyrotoxicosis, unspecified without thyrotoxic crisis or storm: Secondary | ICD-10-CM

## 2014-01-01 ENCOUNTER — Telehealth: Payer: Self-pay

## 2014-01-01 ENCOUNTER — Encounter: Payer: Self-pay | Admitting: Internal Medicine

## 2014-01-01 DIAGNOSIS — B0229 Other postherpetic nervous system involvement: Secondary | ICD-10-CM

## 2014-01-01 NOTE — Telephone Encounter (Signed)
I will see her today and call him after I see her

## 2014-01-01 NOTE — Telephone Encounter (Signed)
[name] pts son left v/m; [facility]; pt was dx 4 weeks ago with shingles. Pt had shingles pain last night and pt does not feel staff is addressing the shingles pain. Nadine Counts[facility] request med for shingles pain. [facility] request cb.

## 2014-01-17 DIAGNOSIS — E039 Hypothyroidism, unspecified: Secondary | ICD-10-CM

## 2014-01-17 DIAGNOSIS — B0229 Other postherpetic nervous system involvement: Secondary | ICD-10-CM

## 2014-01-17 DIAGNOSIS — S32509A Unspecified fracture of unspecified pubis, initial encounter for closed fracture: Secondary | ICD-10-CM

## 2014-02-05 ENCOUNTER — Ambulatory Visit (INDEPENDENT_AMBULATORY_CARE_PROVIDER_SITE_OTHER): Payer: Medicare Other | Admitting: Internal Medicine

## 2014-02-05 ENCOUNTER — Encounter: Payer: Self-pay | Admitting: Internal Medicine

## 2014-02-05 VITALS — BP 112/70 | HR 69 | Temp 98.4°F | Wt 100.0 lb

## 2014-02-05 DIAGNOSIS — B0229 Other postherpetic nervous system involvement: Secondary | ICD-10-CM

## 2014-02-05 DIAGNOSIS — E0590 Thyrotoxicosis, unspecified without thyrotoxic crisis or storm: Secondary | ICD-10-CM

## 2014-02-05 DIAGNOSIS — I10 Essential (primary) hypertension: Secondary | ICD-10-CM

## 2014-02-05 DIAGNOSIS — S329XXA Fracture of unspecified parts of lumbosacral spine and pelvis, initial encounter for closed fracture: Secondary | ICD-10-CM | POA: Insufficient documentation

## 2014-02-05 NOTE — Patient Instructions (Signed)
If the shingles pain improves, you can try to reduce the gabapentin (stop the morning dose first, then we can cut down to 200mg  at bedtime---call and I will change the prescription to just the 100mg  size)

## 2014-02-05 NOTE — Assessment & Plan Note (Signed)
Better now Doing okay at home Still uses the tramadol occasionally

## 2014-02-05 NOTE — Assessment & Plan Note (Signed)
BP Readings from Last 3 Encounters:  02/05/14 112/70  12/06/13 122/82  08/21/13 128/78   Doing well No dyspnea now

## 2014-02-05 NOTE — Progress Notes (Signed)
Pre visit review using our clinic review tool, if applicable. No additional management support is needed unless otherwise documented below in the visit note. 

## 2014-02-05 NOTE — Assessment & Plan Note (Signed)
Okay on PTU Labs next time

## 2014-02-05 NOTE — Assessment & Plan Note (Signed)
Has improved  Okay to try to wean down gabapentin dose

## 2014-02-05 NOTE — Progress Notes (Signed)
Subjective:    Patient ID: [name], female    DOB: Jan 24, 1921, 80 y.o.   MRN: [id number]  HPI Here with son  Having ongoing hearing issues Now with vision problem in left eye Hasn't had it evaluated  Shingles is better Still some pain when under stress Continues on the gabapentin Has been gradually improving--- does affect her at night at times  Pelvic fracture seems to have healed Some intermittent pain with walking Stiff in AM--not new (hips) Uses the tramadol as much as 3 per day  Has daughter to help her frequently Home care twice a week--shopping and cleaning She and daughter do the cooking mostly  Current Outpatient Prescriptions on File Prior to Visit  Medication Sig Dispense Refill  . amLODipine (NORVASC) 5 MG tablet TAKE 1 TABLET BY MOUTH EVERY DAY  90 tablet  1  . aspirin EC 81 MG tablet Take 81 mg by mouth daily.      . cholecalciferol (VITAMIN D) 1000 UNITS tablet Take 1,000 Units by mouth daily.        . metoprolol tartrate (LOPRESSOR) 25 MG tablet TAKE 1/2 TABLET BY MOUTH TWICE A DAY  30 tablet  5  . propylthiouracil (PTU) 50 MG tablet TAKE 3 TABLETS (150 MG TOTAL) BY MOUTH DAILY.  90 tablet  11  . ramipril (ALTACE) 10 MG capsule Take 1 capsule (10 mg total) by mouth daily.  90 capsule  3  . traMADol (ULTRAM) 50 MG tablet TAKE 1/2 TO 1 TABLET 3 TIMES A DAY AS NEEDED FOR BAD ATHRITIS PAIN  90 tablet  0   No current facility-administered medications on file prior to visit.    Allergies  Allergen Reactions  . Methimazole     REACTION: increased LFT's    Past Medical History  Diagnosis Date  . Hypertension   . Arthritis   . Hyperlipidemia   . Osteoporosis   . Right ventricular thrombus   . Cataract   . Hyperthyroidism   . Atrial fibrillation     only with hyperthyroidism  . Pelvic fracture 4/15    Past Surgical History  Procedure Laterality Date  . Mastoidectomy  1932    Family History  Problem Relation Age of Onset  . Cancer  Mother     pancreatic cancer  . COPD Father   . Diabetes Sister   . Coronary artery disease Neg Hx     History   Social History  . Marital Status: Widowed    Spouse Name: N/A    Number of Children: 4  . Years of Education: N/A   Occupational History  . homemaker    Social History Main Topics  . Smoking status: Current Some Day Smoker  . Smokeless tobacco: Never Used     Comment: smokes when stressed  . Alcohol Use: Yes     Comment: wine and burbon  . Drug Use: Not on file  . Sexual Activity: Not on file   Other Topics Concern  . 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 Son, Molly Maduro to make health care decisions. Requests DNR order--she has been familiar with this. No tube feeds   Review of Systems Not sleeping great--some okay nights, trouble initiating at times. Will  fatigue by the end of the day. PHN will affect sleep Appetite never great--no sig change Weight stable    Objective:   Physical Exam  Constitutional: She appears well-developed. No distress.  Neck: Normal range of motion. Neck supple. No thyromegaly present.  Cardiovascular: Normal rate, regular rhythm and normal heart sounds.  Exam reveals no gallop.   No murmur heard. Pulmonary/Chest: Effort normal and breath sounds normal. No respiratory distress. She has no wheezes. She has no rales.  Abdominal: Soft. There is no tenderness.  Musculoskeletal: She exhibits no edema and no tenderness.  Lymphadenopathy:    She has no cervical adenopathy.  Psychiatric: She has a normal mood and affect. Her behavior is normal.          Assessment & Plan:

## 2014-02-06 ENCOUNTER — Telehealth: Payer: Self-pay | Admitting: Internal Medicine

## 2014-02-06 NOTE — Telephone Encounter (Signed)
Relevant patient education assigned to patient using Emmi. ° °

## 2014-02-07 ENCOUNTER — Telehealth: Payer: Self-pay | Admitting: Internal Medicine

## 2014-02-07 NOTE — Telephone Encounter (Signed)
Molly Maduro (pt son) called and patient needs 100mg  gabapentin renewed. [facility]. Please advise.

## 2014-02-07 NOTE — Telephone Encounter (Signed)
Why don't you change rx to 100mg    1 in AM and 2-3 at bedtime She will probably be trying to wean it down some #120 x 3

## 2014-02-08 MED ORDER — GABAPENTIN 100 MG PO CAPS: 100.0000 mg | ORAL_CAPSULE | Freq: Every day | ORAL | Status: DC

## 2014-02-08 NOTE — Telephone Encounter (Signed)
rx sent to pharmacy by e-script  

## 2014-02-20 ENCOUNTER — Other Ambulatory Visit: Payer: Self-pay | Admitting: Internal Medicine

## 2014-02-28 ENCOUNTER — Ambulatory Visit: Payer: Self-pay | Admitting: Family Medicine

## 2014-02-28 ENCOUNTER — Telehealth: Payer: Self-pay | Admitting: Internal Medicine

## 2014-02-28 NOTE — Telephone Encounter (Signed)
I am not concerned about the BP but her fever, breathing need eval You can add her on at 4:45 for me if that is better for her I think she can wait for 6:15 if she and son prefer that

## 2014-02-28 NOTE — Telephone Encounter (Signed)
That sounds fine

## 2014-02-28 NOTE — Telephone Encounter (Signed)
Spoke with son and he states pt's BP is better and he's concerned about the heartrate. I offered the 4:45 but he thinks its too late and she will not make the 6:15 appt because that's too late also. Son will call us back after he talks to pt

## 2014-02-28 NOTE — Telephone Encounter (Signed)
Patient Information:  Caller Name: [name]  Phone: [phone]  Patient: [name], Alice  Gender: Female  DOB: 1921-07-19  Age: 80 Years  PCP: Tillman [facility] , [facility](Family Practice)  Office Follow Up:  Does the office need to follow up with this patient?: Yes  Instructions For The Office: see notes  RN Note:  Unable to really triage as he is not with her currently. Based on description of sxs, advised appt today. Only appt availabe was at 6:15pm tonight with Dr. Patsy Lageropland. Does pt need sooner appt/work-in? or ok to wait until this evening? Please call pts son to let him know. Thanks.  Symptoms  Reason For Call & Symptoms: Intermittent sxs with low grade fever for last few days. Mild SOB on exertion with intermittent elevated BP and HR. Highest BP was 154/86 with HR >100.  No severe sxs but reports feeling "under the weather." Son is concerned about sxs especially with advanced age. Son is calling b/c he states pt is very HOH and it is too difficult to talk with her on the phone. He is not at her house currently but they email back and forth throughout the day and he is heading over to her house shortly to see her; sees her most days.  Reviewed Health History In EMR: Yes  Reviewed Medications In EMR: Yes  Reviewed Allergies In EMR: Yes  Reviewed Surgeries / Procedures: Yes  Date of Onset of Symptoms: 02/25/2014  Treatments Tried: Aspirin  Treatments Tried Worked: No  Guideline(s) Used:  No Protocol Available - Sick Adult  Disposition Per Guideline:   See Today in Office  Reason For Disposition Reached:   Nursing judgment  Advice Given:  Call Back If:  New symptoms develop  You become worse.  Patient Will Follow Care Advice:  YES  Appointment Scheduled:  02/28/2014 18:15:00 Appointment Scheduled Provider:  Hannah Beatopland, [facility](Family Practice)

## 2014-02-28 NOTE — Telephone Encounter (Signed)
Per son pt hasn't had a fever today but would like to be seen for BP issues pt doesn't like late appt and I rescheduled for tomorrow at 11am per son.

## 2014-03-01 ENCOUNTER — Encounter: Payer: Self-pay | Admitting: Internal Medicine

## 2014-03-01 ENCOUNTER — Ambulatory Visit (INDEPENDENT_AMBULATORY_CARE_PROVIDER_SITE_OTHER)
Admission: RE | Admit: 2014-03-01 | Discharge: 2014-03-01 | Disposition: A | Discharge status: Home / Self Care | Payer: Medicare Other | Source: Ambulatory Visit | Attending: Internal Medicine | Admitting: Internal Medicine

## 2014-03-01 ENCOUNTER — Ambulatory Visit (INDEPENDENT_AMBULATORY_CARE_PROVIDER_SITE_OTHER): Payer: Medicare Other | Admitting: Internal Medicine

## 2014-03-01 VITALS — BP 120/70 | HR 87 | Temp 97.4°F | Wt 95.0 lb

## 2014-03-01 DIAGNOSIS — R509 Fever, unspecified: Secondary | ICD-10-CM

## 2014-03-01 DIAGNOSIS — R5381 Other malaise: Secondary | ICD-10-CM

## 2014-03-01 DIAGNOSIS — R5383 Other fatigue: Secondary | ICD-10-CM

## 2014-03-01 LAB — CBC WITH DIFFERENTIAL/PLATELET
Basophils Absolute: 0 10*3/uL (ref 0.0–0.1)
Basophils Relative: 0.5 % (ref 0.0–3.0)
EOS PCT: 1.2 % (ref 0.0–5.0)
Eosinophils Absolute: 0.1 10*3/uL (ref 0.0–0.7)
HEMATOCRIT: 41.3 % (ref 36.0–46.0)
HEMOGLOBIN: 13.3 g/dL (ref 12.0–15.0)
LYMPHS ABS: 0.8 10*3/uL (ref 0.7–4.0)
Lymphocytes Relative: 10.3 % — ABNORMAL LOW (ref 12.0–46.0)
MCHC: 32.2 g/dL (ref 30.0–36.0)
MCV: 87.1 fl (ref 78.0–100.0)
Monocytes Absolute: 0.8 10*3/uL (ref 0.1–1.0)
Monocytes Relative: 10.3 % (ref 3.0–12.0)
NEUTROS ABS: 6.3 10*3/uL (ref 1.4–7.7)
Neutrophils Relative %: 77.7 % — ABNORMAL HIGH (ref 43.0–77.0)
Platelets: 250 10*3/uL (ref 150.0–400.0)
RBC: 4.74 Mil/uL (ref 3.87–5.11)
RDW: 14.3 % (ref 11.5–15.5)
WBC: 8.1 10*3/uL (ref 4.0–10.5)

## 2014-03-01 LAB — COMPREHENSIVE METABOLIC PANEL
ALK PHOS: 116 U/L (ref 39–117)
ALT: 21 U/L (ref 0–35)
AST: 24 U/L (ref 0–37)
Albumin: 3.5 g/dL (ref 3.5–5.2)
BILIRUBIN TOTAL: 0.2 mg/dL (ref 0.2–1.2)
BUN: 24 mg/dL — ABNORMAL HIGH (ref 6–23)
CO2: 26 meq/L (ref 19–32)
CREATININE: 0.8 mg/dL (ref 0.4–1.2)
Calcium: 9.1 mg/dL (ref 8.4–10.5)
Chloride: 103 mEq/L (ref 96–112)
GFR: 72.58 mL/min (ref >60.00)
Glucose, Bld: 105 mg/dL — ABNORMAL HIGH (ref 70–99)
Potassium: 4 mEq/L (ref 3.5–5.1)
Sodium: 138 mEq/L (ref 135–145)
Total Protein: 7.1 g/dL (ref 6.0–8.3)

## 2014-03-01 LAB — T4, FREE: Free T4: 1.08 ng/dL (ref 0.60–1.60)

## 2014-03-01 LAB — SEDIMENTATION RATE: Sed Rate: 59 mm/hr — ABNORMAL HIGH (ref 0–22)

## 2014-03-01 LAB — TSH: TSH: 0.38 u[IU]/mL (ref 0.35–4.50)

## 2014-03-01 NOTE — Progress Notes (Signed)
Pre visit review using our clinic review tool, if applicable. No additional management support is needed unless otherwise documented below in the visit note. 

## 2014-03-01 NOTE — Progress Notes (Signed)
Subjective:    Patient ID: [name], female    DOB: [birth year], 80 y.o.   MRN: [id number]  HPI Here with son  Has had fever for the past 3-4 days Temp mostly up to about 100--tylenol has helped BP transiently high at times (see phone note)-- 169 systolic was highest Energy levels are down  Not eating as well--appetite is off some  No headache No cough but may have had some DOE with moving around. Needs to rest quicker Palpitation with exertion--can feel heart rate up No dysuria or hematuria No skin lesions  Still "unpleasant" feeling from shingles Irritation and pain still and is sensitive Pain by left clavicle when using walker bent over Better than it was but seems to have plateaued  Current Outpatient Prescriptions on File Prior to Visit  Medication Sig Dispense Refill  . amLODipine (NORVASC) 5 MG tablet TAKE 1 TABLET BY MOUTH EVERY DAY  90 tablet  1  . aspirin EC 81 MG tablet Take 81 mg by mouth daily.      . cholecalciferol (VITAMIN D) 1000 UNITS tablet Take 1,000 Units by mouth daily.        . metoprolol tartrate (LOPRESSOR) 25 MG tablet TAKE 1/2 TABLET BY MOUTH TWICE A DAY  30 tablet  5  . propylthiouracil (PTU) 50 MG tablet TAKE 3 TABLETS EVERY MORNING AS DIRECTED  90 tablet  0  . ramipril (ALTACE) 10 MG capsule Take 1 capsule (10 mg total) by mouth daily.  90 capsule  3  . traMADol (ULTRAM) 50 MG tablet TAKE 1/2 TO 1 TABLET 3 TIMES A DAY AS NEEDED FOR BAD ATHRITIS PAIN  90 tablet  0   No current facility-administered medications on file prior to visit.    Allergies  Allergen Reactions  . Methimazole     REACTION: increased LFT's    Past Medical History  Diagnosis Date  . Hypertension   . Arthritis   . Hyperlipidemia   . Osteoporosis   . Right ventricular thrombus   . Cataract   . Hyperthyroidism   . Atrial fibrillation     only with hyperthyroidism  . Pelvic fracture 4/15    Past Surgical History  Procedure Laterality Date  . Mastoidectomy   1932    Family History  Problem Relation Age of Onset  . Cancer Mother     pancreatic cancer  . COPD Father   . Diabetes Sister   . Coronary artery disease Neg Hx     History   Social History  . Marital Status: Widowed    Spouse Name: N/A    Number of Children: 4  . Years of Education: N/A   Occupational History  . homemaker    Social History Main Topics  . Smoking status: Current Some Day Smoker  . Smokeless tobacco: Never Used     Comment: smokes when stressed  . Alcohol Use: Yes     Comment: wine and burbon  . Drug Use: Not on file  . Sexual Activity: Not on file   Other Topics Concern  . 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 Son, Alice MaduroRobert to make health care decisions. Requests DNR order--she has been familiar with this. No tube feeds  Review of Systems Sleeping okay. Sleeps on 2 pillows --no change. No PND No edema No chest pain Hasn't been going out in this heat    Objective:   Physical Exam  Constitutional: She appears well-developed. No distress.  Looks fatigued but no distress  Neck: Normal range of motion. Neck supple.  Cardiovascular: Normal rate, regular rhythm and normal heart sounds.  Exam reveals no gallop.   No murmur heard. Pulmonary/Chest: Effort normal. No respiratory distress. She has no wheezes.  Left basilar crackles  Abdominal: Soft. There is no tenderness.  Lymphadenopathy:    She has no cervical adenopathy.  Skin: No rash noted.  No shingles rash anymore  Psychiatric: She has a normal mood and affect. Her behavior is normal.          Assessment & Plan:

## 2014-03-01 NOTE — Patient Instructions (Signed)
Please call if you have worsening symptoms. I will call and send an antibiotic if the radiologist thinks there is something on the chest x-ray.

## 2014-03-01 NOTE — Assessment & Plan Note (Addendum)
Vague May be related to being shut in due to the heat PHN is some better---and has cut her gabapentin down a bit Will just check labs

## 2014-03-01 NOTE — Assessment & Plan Note (Addendum)
Low grade Some dyspnea and lung findings I don't see clear cut infiltrate on the CXR Will await overread---will try empiric antibiotic if radiologist thinks there is abnormality

## 2014-03-07 ENCOUNTER — Encounter: Payer: Self-pay | Admitting: Internal Medicine

## 2014-03-25 ENCOUNTER — Other Ambulatory Visit: Payer: Self-pay | Admitting: Internal Medicine

## 2014-03-26 ENCOUNTER — Telehealth: Payer: Self-pay | Admitting: Internal Medicine

## 2014-03-26 MED ORDER — GABAPENTIN 100 MG PO CAPS: 100.0000 mg | ORAL_CAPSULE | Freq: Two times a day (BID) | ORAL | Status: AC

## 2014-03-26 NOTE — Telephone Encounter (Signed)
Has cut down to 100mg  bid from 100/300 Will send new prescription reflecting this dosing

## 2014-03-26 NOTE — Telephone Encounter (Signed)
Pt daughter request call back. Wants  to talk about gabapentin rx. The directions that mother understood and directions on bottle are different and is effecting insurance reimbursement.

## 2014-03-26 NOTE — Telephone Encounter (Signed)
Daughter states pt thought she was reducing the Gabapentin from 300mg  at night to the 100mg  tablet twice daily? Please advise

## 2014-04-19 ENCOUNTER — Other Ambulatory Visit: Payer: Self-pay | Admitting: Internal Medicine

## 2014-05-06 ENCOUNTER — Other Ambulatory Visit: Payer: Self-pay | Admitting: Internal Medicine

## 2014-05-14 ENCOUNTER — Other Ambulatory Visit: Payer: Self-pay | Admitting: Internal Medicine

## 2014-06-17 ENCOUNTER — Other Ambulatory Visit: Payer: Self-pay | Admitting: Internal Medicine

## 2014-07-10 ENCOUNTER — Telehealth: Payer: Self-pay | Admitting: *Deleted

## 2014-07-10 NOTE — Telephone Encounter (Signed)
Order written. Please fax

## 2014-07-10 NOTE — Telephone Encounter (Signed)
[facility] calling asking for order for PT & OT for patient. The family is concerned about her mobility, pt staying in bed and only moving around in her villa, please fax order to [facility] [phone]

## 2014-07-10 NOTE — Telephone Encounter (Signed)
Order faxed and scanned.

## 2014-07-22 ENCOUNTER — Other Ambulatory Visit: Payer: Self-pay | Admitting: Internal Medicine

## 2014-07-22 NOTE — Telephone Encounter (Signed)
Ok to fill 

## 2014-07-23 NOTE — Telephone Encounter (Signed)
Okay for 1 year 

## 2014-07-23 NOTE — Telephone Encounter (Signed)
Approved.  

## 2014-07-23 NOTE — Telephone Encounter (Signed)
rx sent to pharmacy by e-script  

## 2014-07-29 ENCOUNTER — Other Ambulatory Visit: Payer: Self-pay | Admitting: Internal Medicine

## 2014-07-30 ENCOUNTER — Ambulatory Visit (INDEPENDENT_AMBULATORY_CARE_PROVIDER_SITE_OTHER): Payer: Medicare Other | Admitting: Internal Medicine

## 2014-07-30 ENCOUNTER — Encounter: Payer: Self-pay | Admitting: Internal Medicine

## 2014-07-30 VITALS — BP 114/70 | HR 96 | Temp 97.7°F | Wt 96.8 lb

## 2014-07-30 DIAGNOSIS — Z23 Encounter for immunization: Secondary | ICD-10-CM

## 2014-07-30 DIAGNOSIS — B0229 Other postherpetic nervous system involvement: Secondary | ICD-10-CM

## 2014-07-30 DIAGNOSIS — E0590 Thyrotoxicosis, unspecified without thyrotoxic crisis or storm: Secondary | ICD-10-CM

## 2014-07-30 DIAGNOSIS — Z111 Encounter for screening for respiratory tuberculosis: Secondary | ICD-10-CM

## 2014-07-30 DIAGNOSIS — I10 Essential (primary) hypertension: Secondary | ICD-10-CM

## 2014-07-30 NOTE — Addendum Note (Signed)
Addended by: Sydell AxonLAWS, REGINA C on: 07/30/2014 01:24 PM   Modules accepted: Orders

## 2014-07-30 NOTE — Assessment & Plan Note (Signed)
Controlled with PTU

## 2014-07-30 NOTE — Progress Notes (Signed)
Pre visit review using our clinic review tool, if applicable. No additional management support is needed unless otherwise documented below in the visit note. 

## 2014-07-30 NOTE — Progress Notes (Signed)
   Subjective:    Patient ID: Alice SporeKatherine Mathews, female    DOB: 04-20-1921, 80 y.o.   MRN: [id number]  HPI Here with son  Moving [place] assisted living [place] Son lives there No new changes in status  Current Outpatient Prescriptions on File Prior to Visit  Medication Sig Dispense Refill  . amLODipine (NORVASC) 5 MG tablet TAKE 1 TABLET BY MOUTH EVERY DAY 90 tablet 1  . aspirin EC 81 MG tablet Take 81 mg by mouth daily.    . cholecalciferol (VITAMIN D) 1000 UNITS tablet Take 1,000 Units by mouth daily.      Marland Kitchen. gabapentin (NEURONTIN) 100 MG capsule Take 1 capsule (100 mg total) by mouth 2 (two) times daily. 60 capsule 5  . metoprolol tartrate (LOPRESSOR) 25 MG tablet TAKE 1/2 TABLET BY MOUTH TWICE A DAY 30 tablet 5  . propylthiouracil (PTU) 50 MG tablet TAKE 3 TABLETS BY MOUTH IN THE MORNING AS DIRECTED 90 tablet 3  . ramipril (ALTACE) 10 MG capsule TAKE 1 CAPSULE (10 MG TOTAL) BY MOUTH DAILY. 90 capsule 3  . traMADol (ULTRAM) 50 MG tablet TAKE 1/2 TO 1 TABLET 3 TIMES A DAY AS NEEDED FOR BAD ATHRITIS PAIN (Patient not taking: Reported on 07/30/2014) 90 tablet 0   No current facility-administered medications on file prior to visit.    Allergies  Allergen Reactions  . Methimazole     REACTION: increased LFT's    Past Medical History  Diagnosis Date  . Hypertension   . Arthritis   . Hyperlipidemia   . Osteoporosis   . Right ventricular thrombus   . Cataract   . Hyperthyroidism   . Atrial fibrillation     only with hyperthyroidism  . Pelvic fracture 4/15    Past Surgical History  Procedure Laterality Date  . Mastoidectomy  1932    Family History  Problem Relation Age of Onset  . Cancer Mother     pancreatic cancer  . COPD Father   . Diabetes Sister   . Coronary artery disease Neg Hx     History   Social History  . Marital Status: Widowed    Spouse Name: N/A    Number of Children: 4  . Years of Education: N/A   Occupational History  .  homemaker    Social History Main Topics  . Smoking status: Current Every Day Smoker -- 0.50 packs/day    Types: Cigarettes  . Smokeless tobacco: Never Used     Comment: smokes when stressed  . Alcohol Use: 0.0 oz/week    0 Not specified per week     Comment: wine and burbon  . Drug Use: No  . Sexual Activity: Not on file   Other Topics Concern  . 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 Son, Alice MaduroRobert to make health care decisions. Requests DNR order--she has been familiar with this. No tube feeds   Review of Systems     Objective:   Physical Exam        Assessment & Plan:

## 2014-07-30 NOTE — Assessment & Plan Note (Signed)
BP Readings from Last 3 Encounters:  07/30/14 114/70  03/01/14 120/70  02/05/14 112/70

## 2014-07-30 NOTE — Assessment & Plan Note (Signed)
Pain persists but controlled 15 minutes spent with forms and planning

## 2014-07-31 ENCOUNTER — Telehealth: Payer: Self-pay | Admitting: Internal Medicine

## 2014-07-31 NOTE — Telephone Encounter (Signed)
emmi emailed °

## 2014-08-02 LAB — TB SKIN TEST
Induration: 0 mm
TB Skin Test: NEGATIVE

## 2014-08-05 ENCOUNTER — Telehealth: Payer: Self-pay | Admitting: Internal Medicine

## 2014-08-05 NOTE — Telephone Encounter (Signed)
Pts son Nadine CountsBob is calling in again and would like to have a call back asap. Pt is moving to assisted living today as long as they get this order in. He did state that until this was done he would call back hourly. I did advise him that Dr Alphonsus SiasLetvak was seeing patients today so it may be a little while before he can get to it.

## 2014-08-05 NOTE — Telephone Encounter (Signed)
Molly MaduroRobert called today stating ms Alice RobinsonsHogan is going to assisted living today and needs an order stating she can administer her own meds

## 2014-08-05 NOTE — Telephone Encounter (Signed)
Order written. Please fax

## 2014-08-06 NOTE — Telephone Encounter (Signed)
I have placed another form on your desk that just came today 08/06/14 3:58pm

## 2014-08-06 NOTE — Telephone Encounter (Signed)
Can't find the order, [name] nor lugene seen it, do you know where you put it?

## 2014-08-07 NOTE — Telephone Encounter (Signed)
Form signed.

## 2014-08-07 NOTE — Telephone Encounter (Signed)
Order faxed and scanned.

## 2014-08-08 ENCOUNTER — Telehealth: Payer: Self-pay

## 2014-08-08 NOTE — Telephone Encounter (Signed)
[name] left v/m that pt received a skin tear to leg. Pt cleaned and dressed area herself; [name] request order for as needed dressing the wound until healed and also wants to know status of OT order.[name] request cb.

## 2014-08-08 NOTE — Telephone Encounter (Signed)
I signed the OT order If they don't have standing orders for skin tears, I would recommend a hydrogel dressing daily till healed. They can steristrip it if needed

## 2014-08-09 NOTE — Telephone Encounter (Signed)
Spoke to DeseretKristy, [employer] [facility] [place] (Nicole was out of office)  and advised per Dr Alphonsus SiasLetvak; verbally expressed understanding.

## 2014-08-20 ENCOUNTER — Encounter: Payer: Medicare Other | Admitting: Internal Medicine

## 2014-11-25 DIAGNOSIS — K5900 Constipation, unspecified: Secondary | ICD-10-CM | POA: Diagnosis not present

## 2014-11-25 DIAGNOSIS — I10 Essential (primary) hypertension: Secondary | ICD-10-CM | POA: Diagnosis not present

## 2014-11-25 DIAGNOSIS — M810 Age-related osteoporosis without current pathological fracture: Secondary | ICD-10-CM | POA: Diagnosis not present

## 2014-11-25 DIAGNOSIS — R5383 Other fatigue: Secondary | ICD-10-CM | POA: Diagnosis not present

## 2014-11-25 DIAGNOSIS — E0590 Thyrotoxicosis, unspecified without thyrotoxic crisis or storm: Secondary | ICD-10-CM | POA: Diagnosis not present

## 2014-11-25 DIAGNOSIS — R42 Dizziness and giddiness: Secondary | ICD-10-CM | POA: Diagnosis not present

## 2014-11-25 DIAGNOSIS — M8580 Other specified disorders of bone density and structure, unspecified site: Secondary | ICD-10-CM | POA: Diagnosis not present

## 2014-12-04 DIAGNOSIS — N3941 Urge incontinence: Secondary | ICD-10-CM | POA: Diagnosis not present

## 2014-12-04 DIAGNOSIS — R4182 Altered mental status, unspecified: Secondary | ICD-10-CM | POA: Diagnosis not present

## 2014-12-07 DIAGNOSIS — Z7982 Long term (current) use of aspirin: Secondary | ICD-10-CM | POA: Diagnosis not present

## 2014-12-07 DIAGNOSIS — R300 Dysuria: Secondary | ICD-10-CM | POA: Diagnosis not present

## 2014-12-07 DIAGNOSIS — N368 Other specified disorders of urethra: Secondary | ICD-10-CM | POA: Diagnosis not present

## 2014-12-17 DIAGNOSIS — E0590 Thyrotoxicosis, unspecified without thyrotoxic crisis or storm: Secondary | ICD-10-CM | POA: Diagnosis not present

## 2014-12-17 DIAGNOSIS — H9192 Unspecified hearing loss, left ear: Secondary | ICD-10-CM | POA: Diagnosis not present

## 2014-12-17 DIAGNOSIS — R000 Tachycardia, unspecified: Secondary | ICD-10-CM | POA: Diagnosis not present

## 2014-12-17 DIAGNOSIS — M810 Age-related osteoporosis without current pathological fracture: Secondary | ICD-10-CM | POA: Diagnosis not present

## 2014-12-17 DIAGNOSIS — I10 Essential (primary) hypertension: Secondary | ICD-10-CM | POA: Diagnosis not present

## 2015-01-30 DIAGNOSIS — M8580 Other specified disorders of bone density and structure, unspecified site: Secondary | ICD-10-CM | POA: Diagnosis not present

## 2015-01-30 DIAGNOSIS — M4854XA Collapsed vertebra, not elsewhere classified, thoracic region, initial encounter for fracture: Secondary | ICD-10-CM | POA: Diagnosis not present

## 2015-01-30 DIAGNOSIS — S22060A Wedge compression fracture of T7-T8 vertebra, initial encounter for closed fracture: Secondary | ICD-10-CM | POA: Diagnosis not present

## 2015-01-30 DIAGNOSIS — M419 Scoliosis, unspecified: Secondary | ICD-10-CM | POA: Diagnosis not present

## 2015-01-30 DIAGNOSIS — M810 Age-related osteoporosis without current pathological fracture: Secondary | ICD-10-CM | POA: Diagnosis not present

## 2015-01-30 DIAGNOSIS — M546 Pain in thoracic spine: Secondary | ICD-10-CM | POA: Diagnosis not present

## 2015-01-30 DIAGNOSIS — M1990 Unspecified osteoarthritis, unspecified site: Secondary | ICD-10-CM | POA: Diagnosis not present

## 2015-01-30 DIAGNOSIS — F17200 Nicotine dependence, unspecified, uncomplicated: Secondary | ICD-10-CM | POA: Diagnosis not present

## 2015-01-30 DIAGNOSIS — M5184 Other intervertebral disc disorders, thoracic region: Secondary | ICD-10-CM | POA: Diagnosis not present

## 2015-01-30 DIAGNOSIS — M5135 Other intervertebral disc degeneration, thoracolumbar region: Secondary | ICD-10-CM | POA: Diagnosis not present

## 2015-01-30 DIAGNOSIS — Z7982 Long term (current) use of aspirin: Secondary | ICD-10-CM | POA: Diagnosis not present

## 2015-01-30 DIAGNOSIS — M549 Dorsalgia, unspecified: Secondary | ICD-10-CM | POA: Diagnosis not present

## 2015-01-30 DIAGNOSIS — I10 Essential (primary) hypertension: Secondary | ICD-10-CM | POA: Diagnosis not present

## 2015-01-30 DIAGNOSIS — M5136 Other intervertebral disc degeneration, lumbar region: Secondary | ICD-10-CM | POA: Diagnosis not present

## 2015-01-30 DIAGNOSIS — Z79899 Other long term (current) drug therapy: Secondary | ICD-10-CM | POA: Diagnosis not present

## 2015-02-01 DIAGNOSIS — S39012A Strain of muscle, fascia and tendon of lower back, initial encounter: Secondary | ICD-10-CM | POA: Diagnosis not present

## 2015-02-01 DIAGNOSIS — M549 Dorsalgia, unspecified: Secondary | ICD-10-CM | POA: Diagnosis not present

## 2015-02-01 DIAGNOSIS — E785 Hyperlipidemia, unspecified: Secondary | ICD-10-CM | POA: Diagnosis not present

## 2015-02-01 DIAGNOSIS — I10 Essential (primary) hypertension: Secondary | ICD-10-CM | POA: Diagnosis not present

## 2015-02-01 DIAGNOSIS — M5489 Other dorsalgia: Secondary | ICD-10-CM | POA: Diagnosis not present

## 2015-02-01 DIAGNOSIS — M545 Low back pain: Secondary | ICD-10-CM | POA: Diagnosis not present

## 2015-02-01 DIAGNOSIS — I4891 Unspecified atrial fibrillation: Secondary | ICD-10-CM | POA: Diagnosis not present

## 2015-02-01 DIAGNOSIS — Z7982 Long term (current) use of aspirin: Secondary | ICD-10-CM | POA: Diagnosis not present

## 2015-02-01 DIAGNOSIS — K5900 Constipation, unspecified: Secondary | ICD-10-CM | POA: Diagnosis not present

## 2015-02-01 DIAGNOSIS — F17200 Nicotine dependence, unspecified, uncomplicated: Secondary | ICD-10-CM | POA: Diagnosis not present

## 2015-02-01 DIAGNOSIS — M810 Age-related osteoporosis without current pathological fracture: Secondary | ICD-10-CM | POA: Diagnosis not present

## 2015-02-04 DIAGNOSIS — M545 Low back pain: Secondary | ICD-10-CM | POA: Diagnosis not present

## 2015-02-04 DIAGNOSIS — E559 Vitamin D deficiency, unspecified: Secondary | ICD-10-CM | POA: Diagnosis not present

## 2015-02-04 DIAGNOSIS — R1084 Generalized abdominal pain: Secondary | ICD-10-CM | POA: Diagnosis not present

## 2015-02-04 DIAGNOSIS — I10 Essential (primary) hypertension: Secondary | ICD-10-CM | POA: Diagnosis not present

## 2015-02-04 DIAGNOSIS — Z79899 Other long term (current) drug therapy: Secondary | ICD-10-CM | POA: Diagnosis not present

## 2015-02-05 DIAGNOSIS — I4891 Unspecified atrial fibrillation: Secondary | ICD-10-CM | POA: Diagnosis not present

## 2015-02-05 DIAGNOSIS — N390 Urinary tract infection, site not specified: Secondary | ICD-10-CM | POA: Diagnosis not present

## 2015-02-05 DIAGNOSIS — Z9181 History of falling: Secondary | ICD-10-CM | POA: Diagnosis not present

## 2015-02-05 DIAGNOSIS — M1990 Unspecified osteoarthritis, unspecified site: Secondary | ICD-10-CM | POA: Diagnosis not present

## 2015-02-05 DIAGNOSIS — M810 Age-related osteoporosis without current pathological fracture: Secondary | ICD-10-CM | POA: Diagnosis not present

## 2015-02-05 DIAGNOSIS — I10 Essential (primary) hypertension: Secondary | ICD-10-CM | POA: Diagnosis not present

## 2015-02-05 DIAGNOSIS — S39012D Strain of muscle, fascia and tendon of lower back, subsequent encounter: Secondary | ICD-10-CM | POA: Diagnosis not present

## 2015-02-05 DIAGNOSIS — R279 Unspecified lack of coordination: Secondary | ICD-10-CM | POA: Diagnosis not present

## 2015-02-05 DIAGNOSIS — Z87311 Personal history of (healed) other pathological fracture: Secondary | ICD-10-CM | POA: Diagnosis not present

## 2015-02-06 DIAGNOSIS — I10 Essential (primary) hypertension: Secondary | ICD-10-CM | POA: Diagnosis not present

## 2015-02-06 DIAGNOSIS — S39012D Strain of muscle, fascia and tendon of lower back, subsequent encounter: Secondary | ICD-10-CM | POA: Diagnosis not present

## 2015-02-06 DIAGNOSIS — M810 Age-related osteoporosis without current pathological fracture: Secondary | ICD-10-CM | POA: Diagnosis not present

## 2015-02-06 DIAGNOSIS — Z87311 Personal history of (healed) other pathological fracture: Secondary | ICD-10-CM | POA: Diagnosis not present

## 2015-02-06 DIAGNOSIS — I4891 Unspecified atrial fibrillation: Secondary | ICD-10-CM | POA: Diagnosis not present

## 2015-02-06 DIAGNOSIS — Z79899 Other long term (current) drug therapy: Secondary | ICD-10-CM | POA: Diagnosis not present

## 2015-02-06 DIAGNOSIS — Z9181 History of falling: Secondary | ICD-10-CM | POA: Diagnosis not present

## 2015-02-06 DIAGNOSIS — M1990 Unspecified osteoarthritis, unspecified site: Secondary | ICD-10-CM | POA: Diagnosis not present

## 2015-02-06 DIAGNOSIS — R279 Unspecified lack of coordination: Secondary | ICD-10-CM | POA: Diagnosis not present

## 2015-02-06 DIAGNOSIS — N390 Urinary tract infection, site not specified: Secondary | ICD-10-CM | POA: Diagnosis not present

## 2015-02-07 DIAGNOSIS — Z79899 Other long term (current) drug therapy: Secondary | ICD-10-CM | POA: Diagnosis not present

## 2015-02-07 DIAGNOSIS — D649 Anemia, unspecified: Secondary | ICD-10-CM | POA: Diagnosis not present

## 2015-02-10 DIAGNOSIS — S39012D Strain of muscle, fascia and tendon of lower back, subsequent encounter: Secondary | ICD-10-CM | POA: Diagnosis not present

## 2015-02-10 DIAGNOSIS — N390 Urinary tract infection, site not specified: Secondary | ICD-10-CM | POA: Diagnosis not present

## 2015-02-10 DIAGNOSIS — R279 Unspecified lack of coordination: Secondary | ICD-10-CM | POA: Diagnosis not present

## 2015-02-10 DIAGNOSIS — Z87311 Personal history of (healed) other pathological fracture: Secondary | ICD-10-CM | POA: Diagnosis not present

## 2015-02-10 DIAGNOSIS — M1990 Unspecified osteoarthritis, unspecified site: Secondary | ICD-10-CM | POA: Diagnosis not present

## 2015-02-10 DIAGNOSIS — I4891 Unspecified atrial fibrillation: Secondary | ICD-10-CM | POA: Diagnosis not present

## 2015-02-10 DIAGNOSIS — M810 Age-related osteoporosis without current pathological fracture: Secondary | ICD-10-CM | POA: Diagnosis not present

## 2015-02-10 DIAGNOSIS — Z9181 History of falling: Secondary | ICD-10-CM | POA: Diagnosis not present

## 2015-02-10 DIAGNOSIS — I10 Essential (primary) hypertension: Secondary | ICD-10-CM | POA: Diagnosis not present

## 2015-02-11 DIAGNOSIS — E559 Vitamin D deficiency, unspecified: Secondary | ICD-10-CM | POA: Diagnosis not present

## 2015-02-11 DIAGNOSIS — S39012D Strain of muscle, fascia and tendon of lower back, subsequent encounter: Secondary | ICD-10-CM | POA: Diagnosis not present

## 2015-02-11 DIAGNOSIS — N390 Urinary tract infection, site not specified: Secondary | ICD-10-CM | POA: Diagnosis not present

## 2015-02-11 DIAGNOSIS — Z9181 History of falling: Secondary | ICD-10-CM | POA: Diagnosis not present

## 2015-02-11 DIAGNOSIS — Z87311 Personal history of (healed) other pathological fracture: Secondary | ICD-10-CM | POA: Diagnosis not present

## 2015-02-11 DIAGNOSIS — M810 Age-related osteoporosis without current pathological fracture: Secondary | ICD-10-CM | POA: Diagnosis not present

## 2015-02-11 DIAGNOSIS — M1990 Unspecified osteoarthritis, unspecified site: Secondary | ICD-10-CM | POA: Diagnosis not present

## 2015-02-11 DIAGNOSIS — R279 Unspecified lack of coordination: Secondary | ICD-10-CM | POA: Diagnosis not present

## 2015-02-11 DIAGNOSIS — M545 Low back pain: Secondary | ICD-10-CM | POA: Diagnosis not present

## 2015-02-11 DIAGNOSIS — I4891 Unspecified atrial fibrillation: Secondary | ICD-10-CM | POA: Diagnosis not present

## 2015-02-11 DIAGNOSIS — I10 Essential (primary) hypertension: Secondary | ICD-10-CM | POA: Diagnosis not present

## 2015-02-12 DIAGNOSIS — Z9181 History of falling: Secondary | ICD-10-CM | POA: Diagnosis not present

## 2015-02-12 DIAGNOSIS — M810 Age-related osteoporosis without current pathological fracture: Secondary | ICD-10-CM | POA: Diagnosis not present

## 2015-02-12 DIAGNOSIS — M1990 Unspecified osteoarthritis, unspecified site: Secondary | ICD-10-CM | POA: Diagnosis not present

## 2015-02-12 DIAGNOSIS — Z87311 Personal history of (healed) other pathological fracture: Secondary | ICD-10-CM | POA: Diagnosis not present

## 2015-02-12 DIAGNOSIS — R279 Unspecified lack of coordination: Secondary | ICD-10-CM | POA: Diagnosis not present

## 2015-02-12 DIAGNOSIS — S39012D Strain of muscle, fascia and tendon of lower back, subsequent encounter: Secondary | ICD-10-CM | POA: Diagnosis not present

## 2015-02-12 DIAGNOSIS — I10 Essential (primary) hypertension: Secondary | ICD-10-CM | POA: Diagnosis not present

## 2015-02-12 DIAGNOSIS — I4891 Unspecified atrial fibrillation: Secondary | ICD-10-CM | POA: Diagnosis not present

## 2015-02-12 DIAGNOSIS — N390 Urinary tract infection, site not specified: Secondary | ICD-10-CM | POA: Diagnosis not present

## 2015-02-13 DIAGNOSIS — N390 Urinary tract infection, site not specified: Secondary | ICD-10-CM | POA: Diagnosis not present

## 2015-02-13 DIAGNOSIS — Z79899 Other long term (current) drug therapy: Secondary | ICD-10-CM | POA: Diagnosis not present

## 2015-02-13 DIAGNOSIS — R279 Unspecified lack of coordination: Secondary | ICD-10-CM | POA: Diagnosis not present

## 2015-02-13 DIAGNOSIS — M810 Age-related osteoporosis without current pathological fracture: Secondary | ICD-10-CM | POA: Diagnosis not present

## 2015-02-13 DIAGNOSIS — I4891 Unspecified atrial fibrillation: Secondary | ICD-10-CM | POA: Diagnosis not present

## 2015-02-13 DIAGNOSIS — I10 Essential (primary) hypertension: Secondary | ICD-10-CM | POA: Diagnosis not present

## 2015-02-13 DIAGNOSIS — S39012D Strain of muscle, fascia and tendon of lower back, subsequent encounter: Secondary | ICD-10-CM | POA: Diagnosis not present

## 2015-02-13 DIAGNOSIS — Z87311 Personal history of (healed) other pathological fracture: Secondary | ICD-10-CM | POA: Diagnosis not present

## 2015-02-13 DIAGNOSIS — Z9181 History of falling: Secondary | ICD-10-CM | POA: Diagnosis not present

## 2015-02-13 DIAGNOSIS — M1990 Unspecified osteoarthritis, unspecified site: Secondary | ICD-10-CM | POA: Diagnosis not present

## 2015-02-13 DIAGNOSIS — D649 Anemia, unspecified: Secondary | ICD-10-CM | POA: Diagnosis not present

## 2015-02-17 DIAGNOSIS — N390 Urinary tract infection, site not specified: Secondary | ICD-10-CM | POA: Diagnosis not present

## 2015-02-17 DIAGNOSIS — I4891 Unspecified atrial fibrillation: Secondary | ICD-10-CM | POA: Diagnosis not present

## 2015-02-17 DIAGNOSIS — R279 Unspecified lack of coordination: Secondary | ICD-10-CM | POA: Diagnosis not present

## 2015-02-17 DIAGNOSIS — M810 Age-related osteoporosis without current pathological fracture: Secondary | ICD-10-CM | POA: Diagnosis not present

## 2015-02-17 DIAGNOSIS — S39012D Strain of muscle, fascia and tendon of lower back, subsequent encounter: Secondary | ICD-10-CM | POA: Diagnosis not present

## 2015-02-17 DIAGNOSIS — Z9181 History of falling: Secondary | ICD-10-CM | POA: Diagnosis not present

## 2015-02-17 DIAGNOSIS — Z87311 Personal history of (healed) other pathological fracture: Secondary | ICD-10-CM | POA: Diagnosis not present

## 2015-02-17 DIAGNOSIS — M1990 Unspecified osteoarthritis, unspecified site: Secondary | ICD-10-CM | POA: Diagnosis not present

## 2015-02-17 DIAGNOSIS — I10 Essential (primary) hypertension: Secondary | ICD-10-CM | POA: Diagnosis not present

## 2015-02-18 DIAGNOSIS — N390 Urinary tract infection, site not specified: Secondary | ICD-10-CM | POA: Diagnosis not present

## 2015-02-18 DIAGNOSIS — M810 Age-related osteoporosis without current pathological fracture: Secondary | ICD-10-CM | POA: Diagnosis not present

## 2015-02-18 DIAGNOSIS — R279 Unspecified lack of coordination: Secondary | ICD-10-CM | POA: Diagnosis not present

## 2015-02-18 DIAGNOSIS — Z87311 Personal history of (healed) other pathological fracture: Secondary | ICD-10-CM | POA: Diagnosis not present

## 2015-02-18 DIAGNOSIS — M1990 Unspecified osteoarthritis, unspecified site: Secondary | ICD-10-CM | POA: Diagnosis not present

## 2015-02-18 DIAGNOSIS — S39012D Strain of muscle, fascia and tendon of lower back, subsequent encounter: Secondary | ICD-10-CM | POA: Diagnosis not present

## 2015-02-18 DIAGNOSIS — I4891 Unspecified atrial fibrillation: Secondary | ICD-10-CM | POA: Diagnosis not present

## 2015-02-18 DIAGNOSIS — I10 Essential (primary) hypertension: Secondary | ICD-10-CM | POA: Diagnosis not present

## 2015-02-18 DIAGNOSIS — Z9181 History of falling: Secondary | ICD-10-CM | POA: Diagnosis not present

## 2015-02-19 DIAGNOSIS — Z87311 Personal history of (healed) other pathological fracture: Secondary | ICD-10-CM | POA: Diagnosis not present

## 2015-02-19 DIAGNOSIS — R279 Unspecified lack of coordination: Secondary | ICD-10-CM | POA: Diagnosis not present

## 2015-02-19 DIAGNOSIS — S39012D Strain of muscle, fascia and tendon of lower back, subsequent encounter: Secondary | ICD-10-CM | POA: Diagnosis not present

## 2015-02-19 DIAGNOSIS — I10 Essential (primary) hypertension: Secondary | ICD-10-CM | POA: Diagnosis not present

## 2015-02-19 DIAGNOSIS — M810 Age-related osteoporosis without current pathological fracture: Secondary | ICD-10-CM | POA: Diagnosis not present

## 2015-02-19 DIAGNOSIS — I4891 Unspecified atrial fibrillation: Secondary | ICD-10-CM | POA: Diagnosis not present

## 2015-02-19 DIAGNOSIS — Z9181 History of falling: Secondary | ICD-10-CM | POA: Diagnosis not present

## 2015-02-19 DIAGNOSIS — M1990 Unspecified osteoarthritis, unspecified site: Secondary | ICD-10-CM | POA: Diagnosis not present

## 2015-02-19 DIAGNOSIS — N390 Urinary tract infection, site not specified: Secondary | ICD-10-CM | POA: Diagnosis not present

## 2015-02-20 DIAGNOSIS — M810 Age-related osteoporosis without current pathological fracture: Secondary | ICD-10-CM | POA: Diagnosis not present

## 2015-02-20 DIAGNOSIS — N390 Urinary tract infection, site not specified: Secondary | ICD-10-CM | POA: Diagnosis not present

## 2015-02-20 DIAGNOSIS — R279 Unspecified lack of coordination: Secondary | ICD-10-CM | POA: Diagnosis not present

## 2015-02-20 DIAGNOSIS — I10 Essential (primary) hypertension: Secondary | ICD-10-CM | POA: Diagnosis not present

## 2015-02-20 DIAGNOSIS — M1990 Unspecified osteoarthritis, unspecified site: Secondary | ICD-10-CM | POA: Diagnosis not present

## 2015-02-20 DIAGNOSIS — S39012D Strain of muscle, fascia and tendon of lower back, subsequent encounter: Secondary | ICD-10-CM | POA: Diagnosis not present

## 2015-02-20 DIAGNOSIS — Z87311 Personal history of (healed) other pathological fracture: Secondary | ICD-10-CM | POA: Diagnosis not present

## 2015-02-20 DIAGNOSIS — Z9181 History of falling: Secondary | ICD-10-CM | POA: Diagnosis not present

## 2015-02-20 DIAGNOSIS — I4891 Unspecified atrial fibrillation: Secondary | ICD-10-CM | POA: Diagnosis not present

## 2015-02-21 DIAGNOSIS — Z79899 Other long term (current) drug therapy: Secondary | ICD-10-CM | POA: Diagnosis not present

## 2015-02-24 DIAGNOSIS — Z9181 History of falling: Secondary | ICD-10-CM | POA: Diagnosis not present

## 2015-02-24 DIAGNOSIS — I4891 Unspecified atrial fibrillation: Secondary | ICD-10-CM | POA: Diagnosis not present

## 2015-02-24 DIAGNOSIS — Z87311 Personal history of (healed) other pathological fracture: Secondary | ICD-10-CM | POA: Diagnosis not present

## 2015-02-24 DIAGNOSIS — S39012D Strain of muscle, fascia and tendon of lower back, subsequent encounter: Secondary | ICD-10-CM | POA: Diagnosis not present

## 2015-02-24 DIAGNOSIS — R279 Unspecified lack of coordination: Secondary | ICD-10-CM | POA: Diagnosis not present

## 2015-02-24 DIAGNOSIS — M810 Age-related osteoporosis without current pathological fracture: Secondary | ICD-10-CM | POA: Diagnosis not present

## 2015-02-24 DIAGNOSIS — N390 Urinary tract infection, site not specified: Secondary | ICD-10-CM | POA: Diagnosis not present

## 2015-02-24 DIAGNOSIS — I10 Essential (primary) hypertension: Secondary | ICD-10-CM | POA: Diagnosis not present

## 2015-02-24 DIAGNOSIS — M1990 Unspecified osteoarthritis, unspecified site: Secondary | ICD-10-CM | POA: Diagnosis not present

## 2015-02-25 DIAGNOSIS — S39012D Strain of muscle, fascia and tendon of lower back, subsequent encounter: Secondary | ICD-10-CM | POA: Diagnosis not present

## 2015-02-25 DIAGNOSIS — N390 Urinary tract infection, site not specified: Secondary | ICD-10-CM | POA: Diagnosis not present

## 2015-02-25 DIAGNOSIS — R279 Unspecified lack of coordination: Secondary | ICD-10-CM | POA: Diagnosis not present

## 2015-02-25 DIAGNOSIS — Z87311 Personal history of (healed) other pathological fracture: Secondary | ICD-10-CM | POA: Diagnosis not present

## 2015-02-25 DIAGNOSIS — M1990 Unspecified osteoarthritis, unspecified site: Secondary | ICD-10-CM | POA: Diagnosis not present

## 2015-02-25 DIAGNOSIS — I4891 Unspecified atrial fibrillation: Secondary | ICD-10-CM | POA: Diagnosis not present

## 2015-02-25 DIAGNOSIS — Z9181 History of falling: Secondary | ICD-10-CM | POA: Diagnosis not present

## 2015-02-25 DIAGNOSIS — M810 Age-related osteoporosis without current pathological fracture: Secondary | ICD-10-CM | POA: Diagnosis not present

## 2015-02-25 DIAGNOSIS — I10 Essential (primary) hypertension: Secondary | ICD-10-CM | POA: Diagnosis not present

## 2015-02-27 DIAGNOSIS — Z9181 History of falling: Secondary | ICD-10-CM | POA: Diagnosis not present

## 2015-02-27 DIAGNOSIS — Z87311 Personal history of (healed) other pathological fracture: Secondary | ICD-10-CM | POA: Diagnosis not present

## 2015-02-27 DIAGNOSIS — M810 Age-related osteoporosis without current pathological fracture: Secondary | ICD-10-CM | POA: Diagnosis not present

## 2015-02-27 DIAGNOSIS — S39012D Strain of muscle, fascia and tendon of lower back, subsequent encounter: Secondary | ICD-10-CM | POA: Diagnosis not present

## 2015-02-27 DIAGNOSIS — N390 Urinary tract infection, site not specified: Secondary | ICD-10-CM | POA: Diagnosis not present

## 2015-02-27 DIAGNOSIS — I10 Essential (primary) hypertension: Secondary | ICD-10-CM | POA: Diagnosis not present

## 2015-02-27 DIAGNOSIS — M1990 Unspecified osteoarthritis, unspecified site: Secondary | ICD-10-CM | POA: Diagnosis not present

## 2015-02-27 DIAGNOSIS — R279 Unspecified lack of coordination: Secondary | ICD-10-CM | POA: Diagnosis not present

## 2015-02-27 DIAGNOSIS — I4891 Unspecified atrial fibrillation: Secondary | ICD-10-CM | POA: Diagnosis not present

## 2015-02-28 DIAGNOSIS — M545 Low back pain: Secondary | ICD-10-CM | POA: Diagnosis not present

## 2015-02-28 DIAGNOSIS — R109 Unspecified abdominal pain: Secondary | ICD-10-CM | POA: Diagnosis not present

## 2015-02-28 DIAGNOSIS — I10 Essential (primary) hypertension: Secondary | ICD-10-CM | POA: Diagnosis not present

## 2015-03-04 DIAGNOSIS — R279 Unspecified lack of coordination: Secondary | ICD-10-CM | POA: Diagnosis not present

## 2015-03-04 DIAGNOSIS — I10 Essential (primary) hypertension: Secondary | ICD-10-CM | POA: Diagnosis not present

## 2015-03-04 DIAGNOSIS — Z9181 History of falling: Secondary | ICD-10-CM | POA: Diagnosis not present

## 2015-03-04 DIAGNOSIS — S39012D Strain of muscle, fascia and tendon of lower back, subsequent encounter: Secondary | ICD-10-CM | POA: Diagnosis not present

## 2015-03-04 DIAGNOSIS — N390 Urinary tract infection, site not specified: Secondary | ICD-10-CM | POA: Diagnosis not present

## 2015-03-04 DIAGNOSIS — M810 Age-related osteoporosis without current pathological fracture: Secondary | ICD-10-CM | POA: Diagnosis not present

## 2015-03-04 DIAGNOSIS — M1990 Unspecified osteoarthritis, unspecified site: Secondary | ICD-10-CM | POA: Diagnosis not present

## 2015-03-04 DIAGNOSIS — I4891 Unspecified atrial fibrillation: Secondary | ICD-10-CM | POA: Diagnosis not present

## 2015-03-04 DIAGNOSIS — Z87311 Personal history of (healed) other pathological fracture: Secondary | ICD-10-CM | POA: Diagnosis not present

## 2015-03-06 DIAGNOSIS — M1990 Unspecified osteoarthritis, unspecified site: Secondary | ICD-10-CM | POA: Diagnosis not present

## 2015-03-06 DIAGNOSIS — N390 Urinary tract infection, site not specified: Secondary | ICD-10-CM | POA: Diagnosis not present

## 2015-03-06 DIAGNOSIS — I4891 Unspecified atrial fibrillation: Secondary | ICD-10-CM | POA: Diagnosis not present

## 2015-03-06 DIAGNOSIS — R279 Unspecified lack of coordination: Secondary | ICD-10-CM | POA: Diagnosis not present

## 2015-03-06 DIAGNOSIS — I10 Essential (primary) hypertension: Secondary | ICD-10-CM | POA: Diagnosis not present

## 2015-03-06 DIAGNOSIS — S39012D Strain of muscle, fascia and tendon of lower back, subsequent encounter: Secondary | ICD-10-CM | POA: Diagnosis not present

## 2015-03-06 DIAGNOSIS — Z87311 Personal history of (healed) other pathological fracture: Secondary | ICD-10-CM | POA: Diagnosis not present

## 2015-03-06 DIAGNOSIS — Z9181 History of falling: Secondary | ICD-10-CM | POA: Diagnosis not present

## 2015-03-06 DIAGNOSIS — M810 Age-related osteoporosis without current pathological fracture: Secondary | ICD-10-CM | POA: Diagnosis not present

## 2015-04-29 ENCOUNTER — Telehealth: Payer: Self-pay | Admitting: *Deleted

## 2015-04-29 NOTE — Telephone Encounter (Signed)
Is this still your patient? Received faxed request for ALPRAZOLAM 0.25  Take 1/2 tab at 8pm Please advise, med not on med list

## 2015-04-30 NOTE — Telephone Encounter (Signed)
Spoke with son and she is no longer a patient of Dr.Letvak's

## 2015-04-30 NOTE — Telephone Encounter (Signed)
Please check with her son I thought she was being seen by a doctor at her [facility] She needs appt ASAP if she is still my patient (though I have not signed any orders so I doubt it)

## 2015-07-08 DEATH — deceased

## 2015-11-06 IMAGING — CR DG LUMBAR SPINE 2-3V
1 series · 3 of 3 positions shown · non-contrast
Comparison: None.

CLINICAL DATA: Pain post trauma

EXAM:
LUMBAR SPINE - 2-3 VIEW

[Series 1: t lumbar spine ap · 0.14mm/px · 3 of 3 slices shown]
[im 1/3]
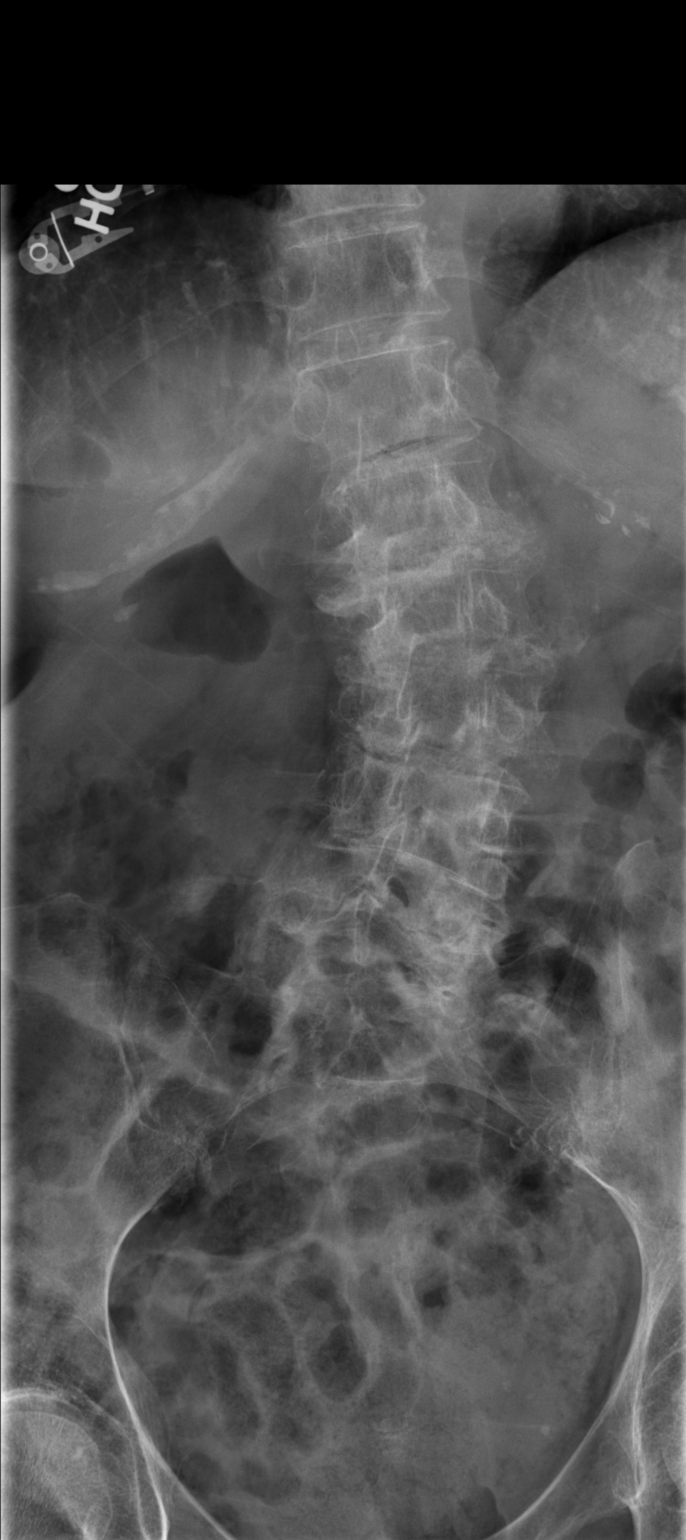
[im 2/3]
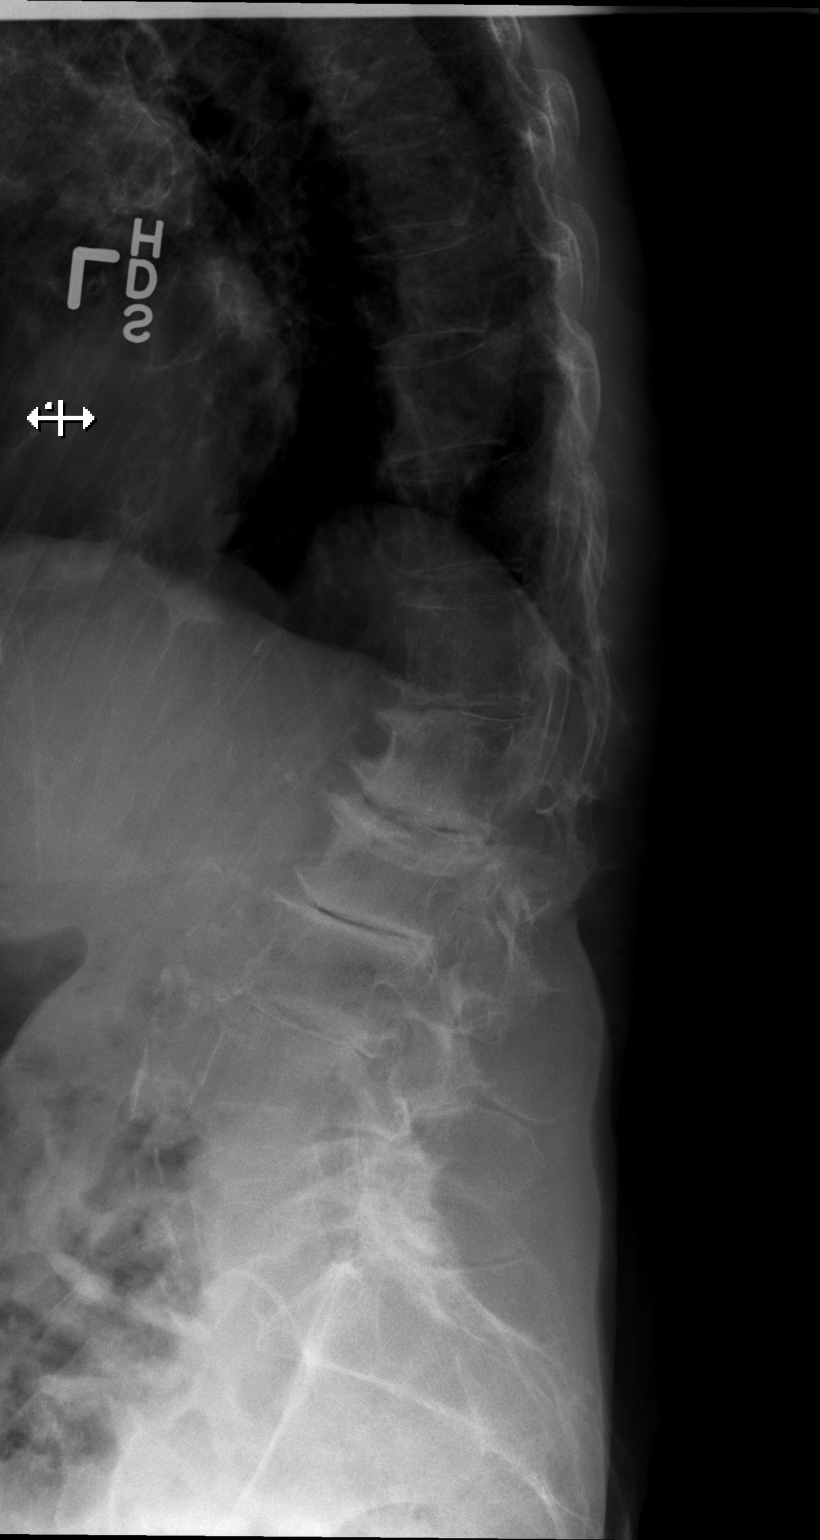
[im 3/3]
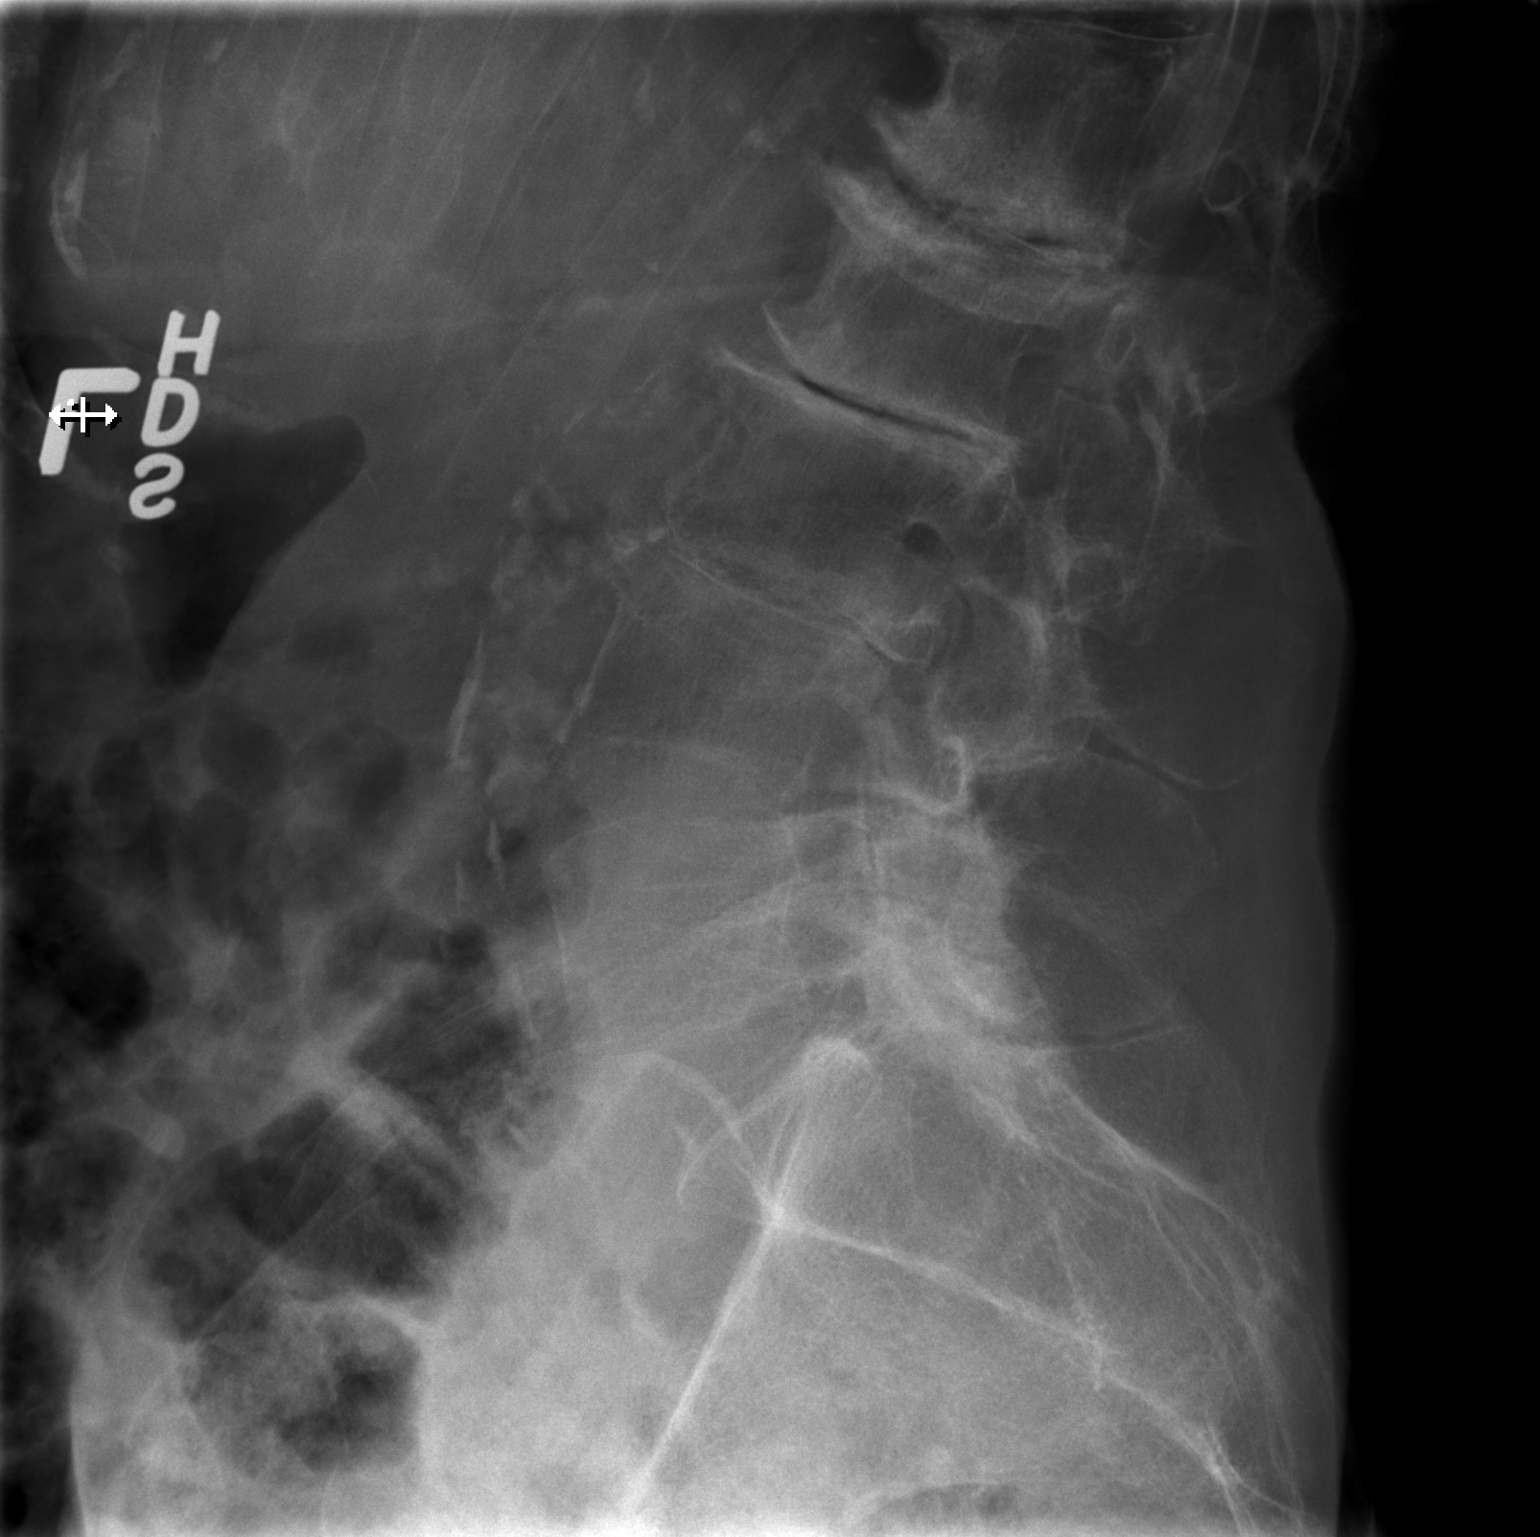

[3 of 3 positions shown; findings below may reference images not displayed]

FINDINGS: Frontal, lateral, and spot lumbosacral lateral images were obtained.
There are 5 non-rib-bearing lumbar type vertebral bodies. There is
no fracture or spondylolisthesis. There is lumbar levorotoscoliosis.
There is marked disc narrowing at all levels. There is no erosive
change. There is atherosclerotic change in the aorta.
IMPRESSION: Scoliosis and multilevel arthropathy. No fracture or
spondylolisthesis. Atherosclerotic change in aorta.

## 2015-11-06 IMAGING — CT CT CERVICAL SPINE WITHOUT CONTRAST
3 of 4 series · 12 of 27 positions shown, 15 images · non-contrast
Comparison: None.

CLINICAL DATA: Pain post trauma

EXAM:
CT HEAD WITHOUT CONTRAST
CT CERVICAL SPINE WITHOUT CONTRAST
TECHNIQUE: Multidetector CT imaging of the head and cervical spine was
performed following the standard protocol without intravenous
contrast. Multiplanar CT image reconstructions of the cervical spine
were also generated.

[Series 5: c spine soft · axial · 0.36mm/px · z∈[+300,+346]mm · 2 of 62 slices shown]
[im 21/62  soft-tissue]
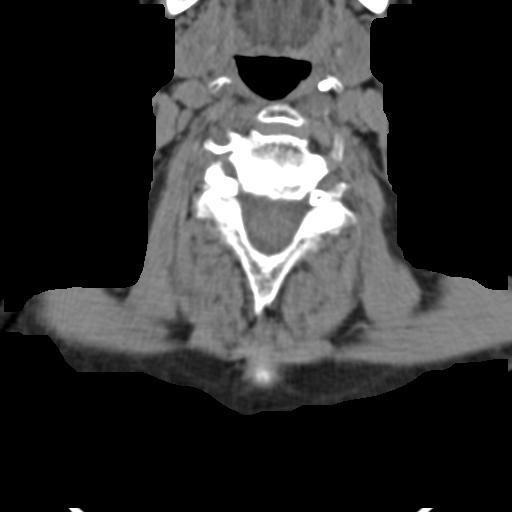
[im 41/62  soft-tissue]
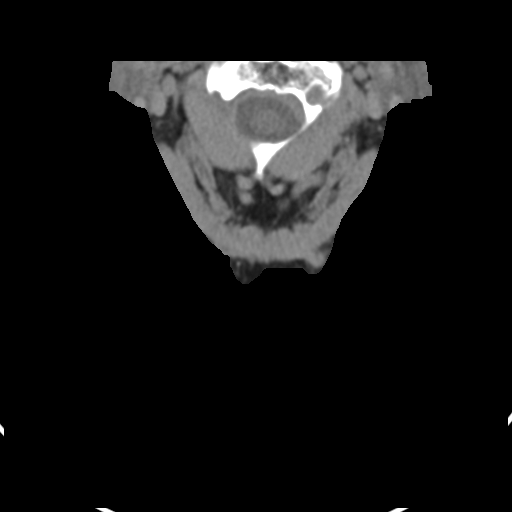

[Series 8: sag bone · sagittal · 0.29mm/px · 5 of 44 slices shown, 6 images]
[im 15/44  bone]
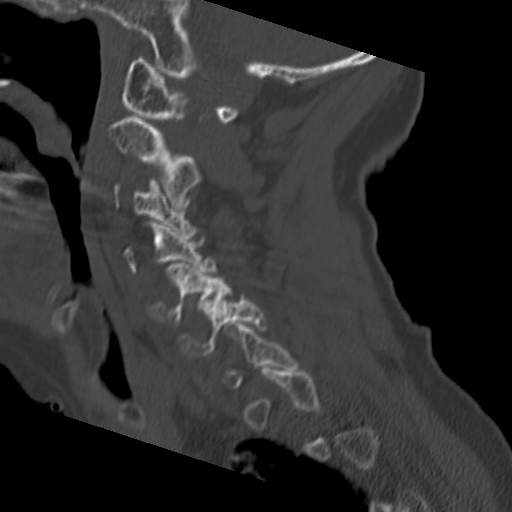
[im 18/44  bone]
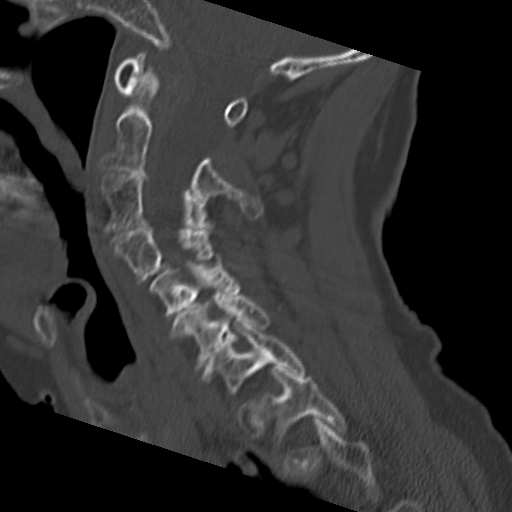
[im 22/44  soft-tissue]
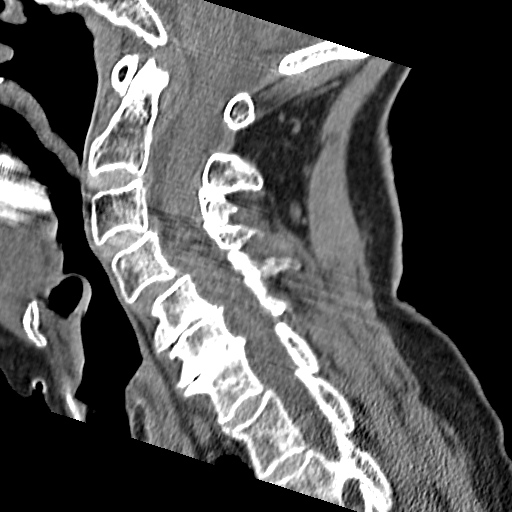
[im 22/44  bone]
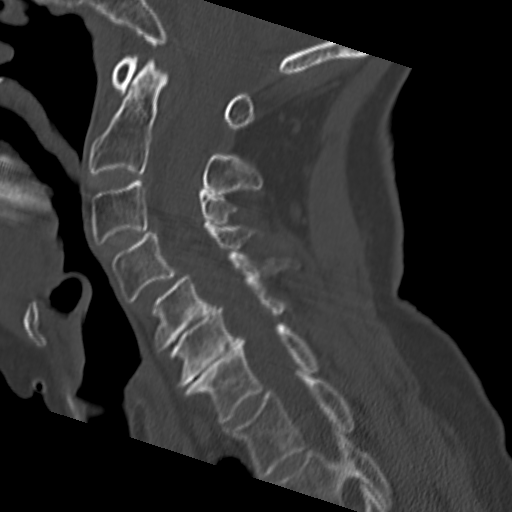
[im 26/44  bone]
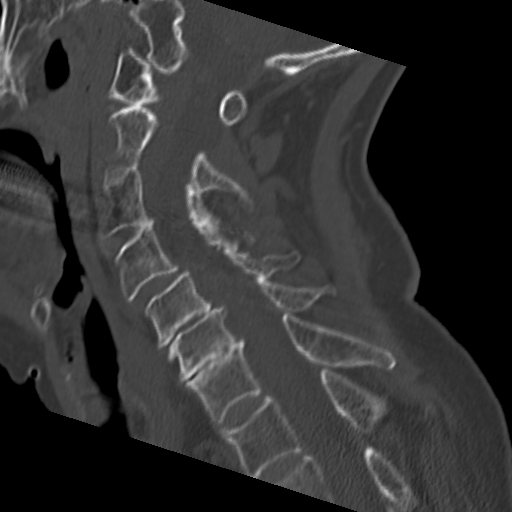
[im 29/44  bone]
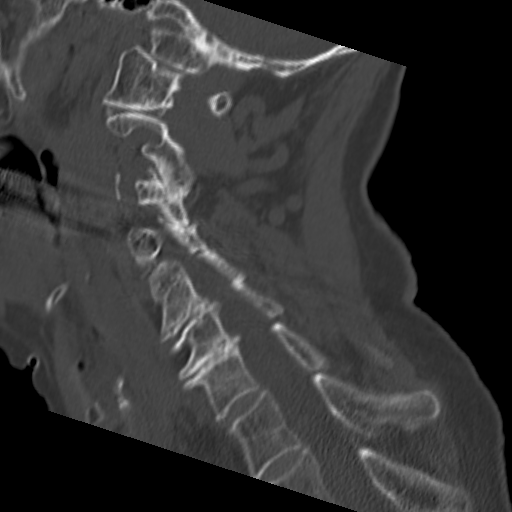

[Series 10: orthogonal axials · axial · 0.24mm/px · z∈[+219,+333]mm · 5 of 100 slices shown, 7 images]
[im 17/100  soft-tissue]
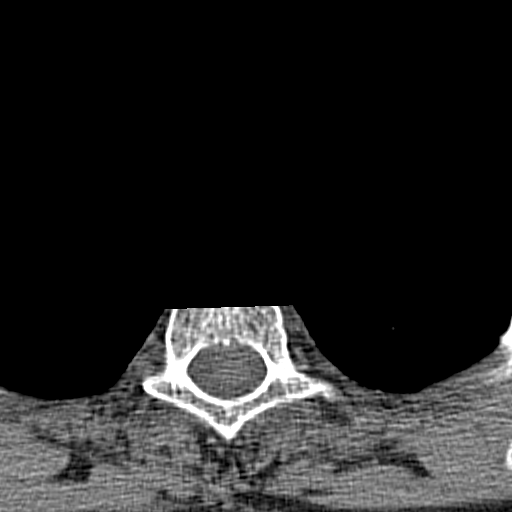
[im 17/100  bone]
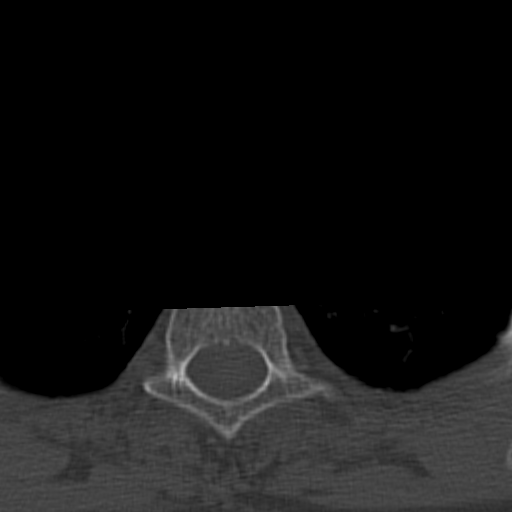
[im 34/100  bone]
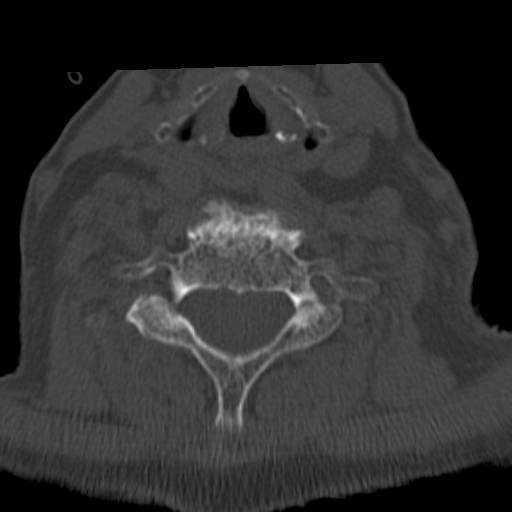
[im 50/100  bone]
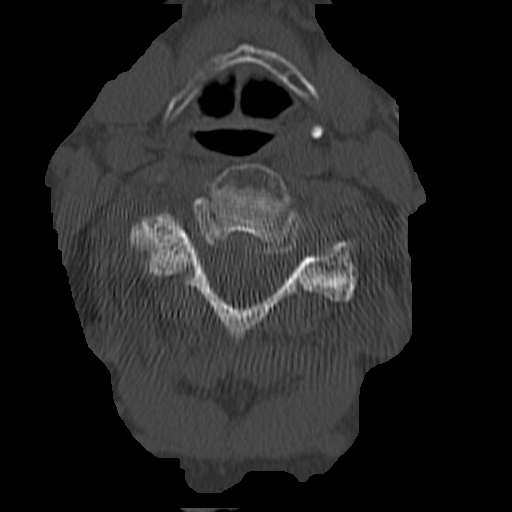
[im 67/100  bone]
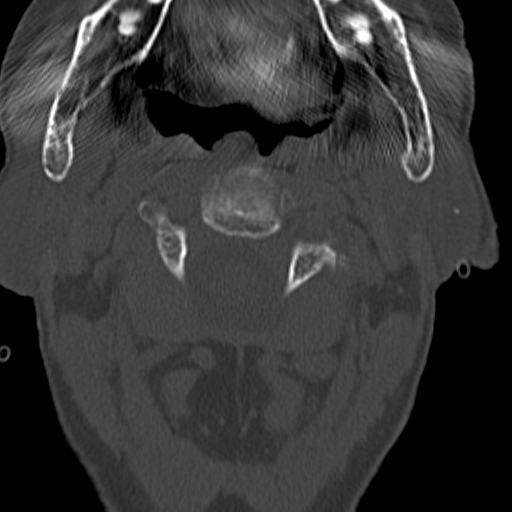
[im 83/100  soft-tissue]
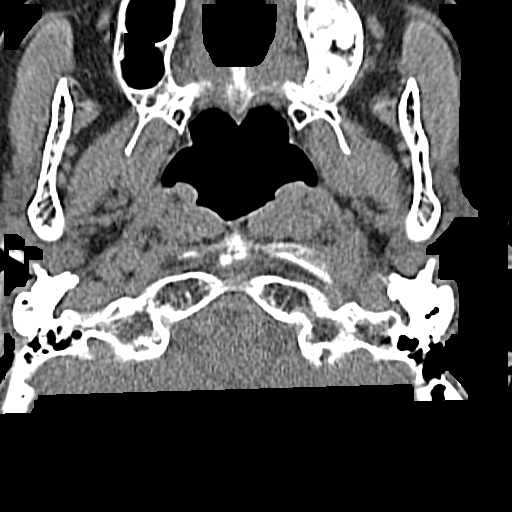
[im 83/100  bone]
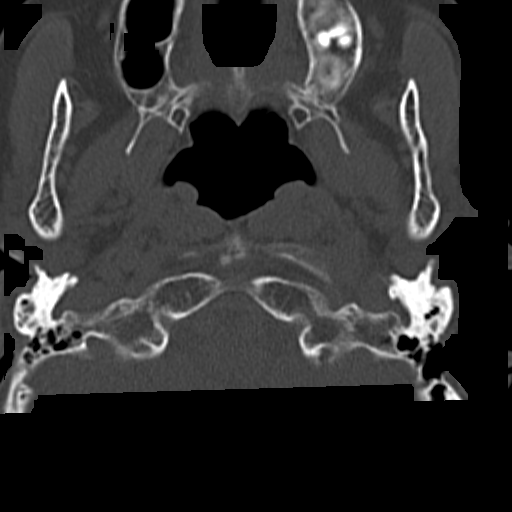

[12 of 27 positions shown; findings below may reference images not displayed]

FINDINGS: CT HEAD FINDINGS

There is mild diffuse atrophy. There is no mass, hemorrhage,
extra-axial fluid collection, or midline shift there is patchy small
vessel disease in the centra semiovale bilaterally there is small
vessel disease in both anterior limbs of the internal and external
capsules as well as in both thalamic region. There is no acute
appearing infarct. The bony calvarium appears intact. The mastoid
air cells are clear.

CT CERVICAL SPINE FINDINGS

There is no fracture. Mild anterolisthesis of C4 on C5 is felt to be
due to spondylosis. Mild anterolisthesis of C5 on C6 is also felt to
be due to spondylosis. No other spondylolisthesis is seen.
Prevertebral soft tissues and predental space regions are normal.

There is marked disc space narrowing at C5-6 and C6-7. There is
multilevel facet osteoarthritic change. There is no frank disc
extrusion or stenosis. There is scarring in the medial right upper
lobe near the apex. There is focal calcification in the left carotid
artery.
IMPRESSION: CT head: Atrophy with small vessel disease. No intracranial mass,
hemorrhage, or acute appearing infarct.

CT cervical spine: No fracture. Spondylolisthesis at C4-5 and C5-6
is felt to be due to underlying spondylosis. There is no other
spondylolisthesis. There is multilevel facet osteoarthritic change,
most marked at C5-6 and C6-7.
